# Patient Record
Sex: Female | Born: 1990 | Race: Black or African American | Hispanic: No | Marital: Single | Smoking: Never smoker
Health system: Southern US, Community
[De-identification: ages and names within clinical notes are randomized; demographics above are authoritative.]

## PROBLEM LIST (undated history)

## (undated) DIAGNOSIS — J45909 Unspecified asthma, uncomplicated: Secondary | ICD-10-CM

## (undated) DIAGNOSIS — G40909 Epilepsy, unspecified, not intractable, without status epilepticus: Secondary | ICD-10-CM

## (undated) DIAGNOSIS — R569 Unspecified convulsions: Secondary | ICD-10-CM

## (undated) HISTORY — PX: VAGAL NERVE STIMULATOR REMOVAL: SUR1110

## (undated) HISTORY — PX: IMPLANTATION VAGAL NERVE STIMULATOR: SUR692

## (undated) HISTORY — PX: BREAST SURGERY: SHX581

---

## 2014-10-20 ENCOUNTER — Emergency Department: Payer: Self-pay | Admitting: Emergency Medicine

## 2014-12-23 ENCOUNTER — Emergency Department: Payer: Self-pay | Admitting: Emergency Medicine

## 2015-01-28 ENCOUNTER — Emergency Department
Admit: 2015-01-28 | Disposition: A | Discharge status: Home / Self Care | Payer: Self-pay | Admitting: Physician Assistant

## 2015-03-20 ENCOUNTER — Emergency Department: Admission: EM | Admit: 2015-03-20 | Discharge: 2015-03-20 | Discharge status: Against Medical Advice | Payer: Self-pay

## 2015-03-20 DIAGNOSIS — M79602 Pain in left arm: Secondary | ICD-10-CM | POA: Insufficient documentation

## 2015-03-20 NOTE — ED Notes (Signed)
No answer when called from lobby 

## 2015-05-18 ENCOUNTER — Encounter: Payer: Self-pay | Admitting: Family Medicine

## 2015-05-18 ENCOUNTER — Emergency Department
Admission: EM | Admit: 2015-05-18 | Discharge: 2015-05-18 | Disposition: A | Discharge status: Home / Self Care | Payer: Self-pay | Attending: Emergency Medicine | Admitting: Emergency Medicine

## 2015-05-18 DIAGNOSIS — G40909 Epilepsy, unspecified, not intractable, without status epilepticus: Secondary | ICD-10-CM | POA: Insufficient documentation

## 2015-05-18 DIAGNOSIS — M79661 Pain in right lower leg: Secondary | ICD-10-CM | POA: Insufficient documentation

## 2015-05-18 DIAGNOSIS — M62838 Other muscle spasm: Secondary | ICD-10-CM | POA: Insufficient documentation

## 2015-05-18 HISTORY — DX: Unspecified convulsions: R56.9

## 2015-05-18 LAB — CBC WITH DIFFERENTIAL/PLATELET
BASOS ABS: 0.1 10*3/uL (ref 0–0.1)
Basophils Relative: 1 %
EOS ABS: 0.1 10*3/uL (ref 0–0.7)
Eosinophils Relative: 1 %
HCT: 33.2 % — ABNORMAL LOW (ref 35.0–47.0)
Hemoglobin: 10.4 g/dL — ABNORMAL LOW (ref 12.0–16.0)
LYMPHS PCT: 31 %
Lymphs Abs: 2.2 10*3/uL (ref 1.0–3.6)
MCH: 22.3 pg — AB (ref 26.0–34.0)
MCHC: 31.5 g/dL — ABNORMAL LOW (ref 32.0–36.0)
MCV: 70.8 fL — ABNORMAL LOW (ref 80.0–100.0)
MONOS PCT: 9 %
Monocytes Absolute: 0.6 10*3/uL (ref 0.2–0.9)
Neutro Abs: 4.2 10*3/uL (ref 1.4–6.5)
Neutrophils Relative %: 58 %
Platelets: 425 10*3/uL (ref 150–440)
RBC: 4.68 MIL/uL (ref 3.80–5.20)
RDW: 17.8 % — AB (ref 11.5–14.5)
WBC: 7.2 10*3/uL (ref 3.6–11.0)

## 2015-05-18 LAB — COMPREHENSIVE METABOLIC PANEL
ALK PHOS: 49 U/L (ref 38–126)
ALT: 16 U/L (ref 14–54)
ANION GAP: 7 (ref 5–15)
AST: 17 U/L (ref 15–41)
Albumin: 3.5 g/dL (ref 3.5–5.0)
BUN: 8 mg/dL (ref 6–20)
CALCIUM: 8.8 mg/dL — AB (ref 8.9–10.3)
CO2: 26 mmol/L (ref 22–32)
Chloride: 104 mmol/L (ref 101–111)
Creatinine, Ser: 0.65 mg/dL (ref 0.44–1.00)
GFR calc Af Amer: >60 mL/min (ref >60)
GLUCOSE: 87 mg/dL (ref 65–99)
Potassium: 4.1 mmol/L (ref 3.5–5.1)
Sodium: 137 mmol/L (ref 135–145)
Total Bilirubin: 0.3 mg/dL (ref 0.3–1.2)
Total Protein: 7.4 g/dL (ref 6.5–8.1)

## 2015-05-18 LAB — CK: Total CK: 139 U/L (ref 38–234)

## 2015-05-18 MED ORDER — DIAZEPAM 2 MG PO TABS: 2.0000 mg | ORAL_TABLET | Freq: Three times a day (TID) | ORAL | Status: DC | PRN

## 2015-05-18 MED ORDER — METHOCARBAMOL 500 MG PO TABS: 1500.0000 mg | ORAL_TABLET | Freq: Once | ORAL | Status: DC

## 2015-05-18 MED ORDER — DIAZEPAM 5 MG PO TABS
5.0000 mg | ORAL_TABLET | Freq: Once | ORAL | Status: AC
  Administered 2015-05-18: 5 mg via ORAL
  Filled 2015-05-18: qty 1

## 2015-05-18 NOTE — ED Notes (Signed)
C/o pain behind both knees past week, hurts to straighten out, states had seizure prior to pain starting

## 2015-05-18 NOTE — Discharge Instructions (Signed)
Please make sure you get your Keppra filled on Monday. Return to the ER for symptoms that change or worsen or for new concerns.

## 2015-05-18 NOTE — ED Provider Notes (Signed)
[facility] Emergency Department Provider Note ____________________________________________  Time seen: Approximately 10:10 AM  I have reviewed the triage vital signs and the nursing notes.   HISTORY  Chief Complaint Leg Pain   HPI Margaret Clark is a 24 y.o. female who presents to the emergency department for evaluation of bilateral lower extremity pain. She states the pain has been present since having a seizure last week and is worsening to the point it is painful to bear weight. She states she is "trying to walk with my knees bent." The seizure was witnessed and she denies specific injury to her legs. She states she has been out of her Keppra for "a long time" due to insurance issues, but has made arrangements to get the prescription filled on Monday.  Past Medical History  Diagnosis Date  . Seizures     There are no active problems to display for this patient.   No past surgical history on file.  Current Outpatient Rx  Name  Route  Sig  Dispense  Refill  . diazepam (VALIUM) 2 MG tablet   Oral   Take 1 tablet (2 mg total) by mouth every 8 (eight) hours as needed for anxiety or muscle spasms.   20 tablet   0     Allergies Review of patient's allergies indicates no known allergies.  No family history on file.  Social History History  Substance Use Topics  . Smoking status: Not on file  . Smokeless tobacco: Not on file  . Alcohol Use: Not on file    Review of Systems Constitutional: No recent illness. Eyes: No visual changes. ENT: No sore throat. Cardiovascular: Denies chest pain or palpitations. Respiratory: Denies shortness of breath. Gastrointestinal: No abdominal pain.  Genitourinary: Negative for dysuria. Musculoskeletal: Pain in bilateral lower extremities. Skin: Negative for rash. Neurological: Negative for headaches, focal weakness or numbness. 10-point ROS otherwise  negative.  ____________________________________________   PHYSICAL EXAM:  VITAL SIGNS: ED Triage Vitals  Enc Vitals Group     BP 05/18/15 0854 105/62 mmHg     Pulse Rate 05/18/15 0854 90     Resp 05/18/15 0854 18     Temp 05/18/15 0854 98.3 F (36.8 C)     Temp Source 05/18/15 0854 Oral     SpO2 05/18/15 0854 99 %     Weight 05/18/15 0854 245 lb (111.131 kg)     Height 05/18/15 0854  (1.626 m)     Head Cir --      Peak Flow --      Pain Score 05/18/15 0905 8     Pain Loc --      Pain Edu? --      Excl. in GC? --     Constitutional: Alert and oriented. Well appearing and in no acute distress. Eyes: Conjunctivae are normal. EOMI. Head: Atraumatic. Nose: No congestion/rhinnorhea. Neck: No stridor.  Respiratory: Normal respiratory effort.   Musculoskeletal: limited extension of knees due to muscle pain in bilateral calves Neurologic:  Normal speech and language. No gross focal neurologic deficits are appreciated. Speech is normal. No gait instability. Skin:  Skin is warm, dry and intact. Atraumatic. Psychiatric: Mood and affect are normal. Speech and behavior are normal.  ____________________________________________   LABS (all labs ordered are listed, but only abnormal results are displayed)  Labs Reviewed  COMPREHENSIVE METABOLIC PANEL - Abnormal; Notable for the following:    Calcium 8.8 (*)    All other components within normal limits  CBC WITH DIFFERENTIAL/PLATELET - Abnormal; Notable for the following:    Hemoglobin 10.4 (*)    HCT 33.2 (*)    MCV 70.8 (*)    MCH 22.3 (*)    MCHC 31.5 (*)    RDW 17.8 (*)    All other components within normal limits  CK   ____________________________________________  RADIOLOGY  Not indicated. ____________________________________________   PROCEDURES  Procedure(s) performed: None   ____________________________________________   INITIAL IMPRESSION / ASSESSMENT AND PLAN / ED COURSE  Pertinent labs &  imaging results that were available during my care of the patient were reviewed by me and considered in my medical decision making (see chart for details).  Prescription for diazepam 2 mg will be given to help prevent seizures as well as help with muscle pain. The patient was advised to get her Keppra prescription filled on Monday as planned. She was advised to return to the emergency department for symptoms that change or worsen if she is unable schedule an appointment. She was also advised that she is slightly anemic and will need to follow-up with primary care to monitor her hemoglobin and hematocrit. She states that this will now be possible since she has insurance. ____________________________________________   FINAL CLINICAL IMPRESSION(S) / ED DIAGNOSES  Final diagnoses:  Muscle spasms of both lower extremities      Chinita Pester, FNP 05/18/15 1530  [name], MD 05/19/15 [phone]

## 2015-05-18 NOTE — ED Notes (Signed)
Pt states that she is having pain in her bilat posterior legs from her calves to her mid hamstrings. Pt denies injury or back pain

## 2015-07-03 ENCOUNTER — Encounter: Payer: Self-pay | Admitting: *Deleted

## 2015-07-03 ENCOUNTER — Emergency Department
Admission: EM | Admit: 2015-07-03 | Discharge: 2015-07-03 | Disposition: A | Discharge status: Home / Self Care | Payer: No Typology Code available for payment source | Attending: Emergency Medicine | Admitting: Emergency Medicine

## 2015-07-03 DIAGNOSIS — R112 Nausea with vomiting, unspecified: Secondary | ICD-10-CM | POA: Diagnosis not present

## 2015-07-03 DIAGNOSIS — J029 Acute pharyngitis, unspecified: Secondary | ICD-10-CM | POA: Diagnosis present

## 2015-07-03 DIAGNOSIS — J069 Acute upper respiratory infection, unspecified: Secondary | ICD-10-CM | POA: Diagnosis not present

## 2015-07-03 MED ORDER — ONDANSETRON 8 MG PO TBDP
8.0000 mg | ORAL_TABLET | Freq: Once | ORAL | Status: AC
  Administered 2015-07-03: 8 mg via ORAL
  Filled 2015-07-03: qty 1

## 2015-07-03 MED ORDER — IBUPROFEN 800 MG PO TABS
800.0000 mg | ORAL_TABLET | Freq: Once | ORAL | Status: AC
  Administered 2015-07-03: 800 mg via ORAL

## 2015-07-03 MED ORDER — ONDANSETRON HCL 4 MG PO TABS: 4.0000 mg | ORAL_TABLET | Freq: Every day | ORAL | Status: DC | PRN

## 2015-07-03 MED ORDER — IBUPROFEN 800 MG PO TABS
ORAL_TABLET | ORAL | Status: DC
Start: 2015-07-03 — End: 2015-07-03
  Filled 2015-07-03: qty 1

## 2015-07-03 MED ORDER — DICLOFENAC SODIUM 75 MG PO TBEC: 75.0000 mg | DELAYED_RELEASE_TABLET | Freq: Two times a day (BID) | ORAL | Status: DC

## 2015-07-03 NOTE — Discharge Instructions (Signed)

## 2015-07-03 NOTE — ED Notes (Signed)
Pt states for 3 days she has had a cough, sneezing, and pressure in her head. Temp 100 today at home, no meds prior to arrival.

## 2015-07-03 NOTE — ED Provider Notes (Signed)
[facility] Emergency Department Provider Note  ____________________________________________  Time seen: Approximately 7:10 PM  I have reviewed the triage vital signs and the nursing notes.   HISTORY  Chief Complaint URI   HPI KARYSSA AMARAL is a 24 y.o. female presenting to the ED today with four days of cough, sneezing and sore throat. She reports the cough is non-productive and present throughout the day. She reports general malaise, fatigue and body aches as well as some chest discomfort with coughing. She reports nausea with three episodes of emesis today but denies any diarrhea. She states she had a fever of 100.0 recorded at home today and has a fever or 100.7 in the ED today. She denies using any OTC medications.   Past Medical History  Diagnosis Date  . Seizures     There are no active problems to display for this patient.   History reviewed. No pertinent past surgical history.  Current Outpatient Rx  Name  Route  Sig  Dispense  Refill  . diazepam (VALIUM) 2 MG tablet   Oral   Take 1 tablet (2 mg total) by mouth every 8 (eight) hours as needed for anxiety or muscle spasms.   20 tablet   0   . diclofenac (VOLTAREN) 75 MG EC tablet   Oral   Take 1 tablet (75 mg total) by mouth 2 (two) times daily.   14 tablet   0   . ondansetron (ZOFRAN) 4 MG tablet   Oral   Take 1 tablet (4 mg total) by mouth daily as needed for nausea or vomiting.   8 tablet   0     Allergies Review of patient's allergies indicates no known allergies.  No family history on file.  Social History Social History  Substance Use Topics  . Smoking status: Never Smoker   . Smokeless tobacco: None  . Alcohol Use: Yes    Review of Systems Constitutional: Fever and chills present. Eyes: No visual changes. ENT: Positive for sore throat, sinus discomfort, and runny nose.  Cardiovascular: Denies chest pain. Respiratory: Denies shortness of  breath. Gastrointestinal: No abdominal pain. Positive for nausea and vomiting.  No diarrhea.  No constipation. Genitourinary: Negative for dysuria. Musculoskeletal: Negative for back pain. Skin: Negative for rash. Neurological: Negative for headaches, focal weakness or numbness.  10-point ROS otherwise negative.  ____________________________________________   PHYSICAL EXAM:  VITAL SIGNS: ED Triage Vitals  Enc Vitals Group     BP 07/03/15 1825 129/73 mmHg     Pulse Rate 07/03/15 1825 119     Resp 07/03/15 1825 18     Temp 07/03/15 1825 100.7 F (38.2 C)     Temp Source 07/03/15 1825 Oral     SpO2 07/03/15 1825 97 %     Weight 07/03/15 1825 245 lb (111.131 kg)     Height 07/03/15 1825 5\' 4"  (1.626 m)     Head Cir --      Peak Flow --      Pain Score 07/03/15 1825 10     Pain Loc --      Pain Edu? --      Excl. in GC? --     Constitutional: Alert and oriented. Well appearing and in mild discomfort. Eyes: Conjunctivae are normal. PERRL. EOMI. Head: Atraumatic. Nose: Mild congestion/rhinnorhea. Mouth/Throat: Mucous membranes are moist.  Oropharynx non-erythematous. Neck: No stridor. No cervical tenderness or lymphadenopathy.  Cardiovascular: Normal rate, regular rhythm. Grossly normal heart sounds.  Good peripheral  circulation. Respiratory: Normal respiratory effort.  No retractions. Lungs CTAB. Gastrointestinal: Soft and nontender. No distention. No abdominal bruits. No CVA tenderness. Musculoskeletal: No lower extremity tenderness nor edema.  No joint effusions. Neurologic:  Normal speech and language. No gross focal neurologic deficits are appreciated. No gait instability. Skin:  Skin is warm, dry and intact. No rash noted. Psychiatric: Mood and affect are normal. Speech and behavior are normal.  ____________________________________________   LABS (all labs ordered are listed, but only abnormal results are displayed)  Labs Reviewed - No data to  display ____________________________________________  EKG   ____________________________________________  RADIOLOGY   ____________________________________________   PROCEDURES  Procedure(s) performed: None  Critical Care performed: No  ____________________________________________   INITIAL IMPRESSION / ASSESSMENT AND PLAN / ED COURSE  Pertinent labs & imaging results that were available during my care of the patient were reviewed by me and considered in my medical decision making (see chart for details).  Patient presents with a viral URI x 4 days. Instructed to continue with fluid and rest, as well as tylenol for fevers and body aches. Prescribed zofran for nausea. Motrin 800 mg given in the ED for discomfort with coughing and Voltaren prescribed for seven days.  ____________________________________________   FINAL CLINICAL IMPRESSION(S) / ED DIAGNOSES  Final diagnoses:  Viral URI      [name], PA-C 07/03/15 2028  Loleta Rose, MD 07/03/15 [phone]

## 2015-07-24 ENCOUNTER — Emergency Department
Admission: EM | Admit: 2015-07-24 | Discharge: 2015-07-24 | Disposition: A | Discharge status: Home / Self Care | Payer: No Typology Code available for payment source | Attending: Emergency Medicine | Admitting: Emergency Medicine

## 2015-07-24 DIAGNOSIS — Z79899 Other long term (current) drug therapy: Secondary | ICD-10-CM | POA: Insufficient documentation

## 2015-07-24 DIAGNOSIS — M436 Torticollis: Secondary | ICD-10-CM | POA: Diagnosis not present

## 2015-07-24 DIAGNOSIS — M542 Cervicalgia: Secondary | ICD-10-CM | POA: Diagnosis present

## 2015-07-24 MED ORDER — NAPROXEN 500 MG PO TABS: 500.0000 mg | ORAL_TABLET | Freq: Two times a day (BID) | ORAL | Status: DC

## 2015-07-24 MED ORDER — CYCLOBENZAPRINE HCL 10 MG PO TABS: 10.0000 mg | ORAL_TABLET | Freq: Three times a day (TID) | ORAL | Status: DC | PRN

## 2015-07-24 NOTE — Discharge Instructions (Signed)
Buy a new pillow to see if that will help. Use a heating pad 20 minutes per hour. Follow up with the primary care provider of your choice.

## 2015-07-24 NOTE — ED Provider Notes (Signed)
[facility] Emergency Department Provider Note ____________________________________________  Time seen: Approximately 1:03 PM  I have reviewed the triage vital signs and the nursing notes.   HISTORY  Chief Complaint Neck Pain   HPI Margaret Clark is a 24 y.o. female who presents again to the emergency department for evaluation of neck pain. She states that the pain is mainly on the left side of the neck, but both sides hurt when laying down at night. She states that she sleeps on a big couch pillow. She has not been taken anything for pain over the past few days.   Past Medical History  Diagnosis Date  . Seizures (HCC)     There are no active problems to display for this patient.   History reviewed. No pertinent past surgical history.  Current Outpatient Rx  Name  Route  Sig  Dispense  Refill  . levETIRAcetam (KEPPRA) 500 MG tablet   Oral   Take 500 mg by mouth 4 (four) times daily.         . cyclobenzaprine (FLEXERIL) 10 MG tablet   Oral   Take 1 tablet (10 mg total) by mouth 3 (three) times daily as needed for muscle spasms.   30 tablet   0   . diazepam (VALIUM) 2 MG tablet   Oral   Take 1 tablet (2 mg total) by mouth every 8 (eight) hours as needed for anxiety or muscle spasms.   20 tablet   0   . naproxen (NAPROSYN) 500 MG tablet   Oral   Take 1 tablet (500 mg total) by mouth 2 (two) times daily with a meal.   60 tablet   2   . ondansetron (ZOFRAN) 4 MG tablet   Oral   Take 1 tablet (4 mg total) by mouth daily as needed for nausea or vomiting.   8 tablet   0     Allergies Shellfish allergy  No family history on file.  Social History Social History  Substance Use Topics  . Smoking status: Never Smoker   . Smokeless tobacco: None  . Alcohol Use: Yes    Review of Systems Constitutional: No recent illness. Eyes: No visual changes. ENT: No sore throat. Cardiovascular: Denies chest pain or  palpitations. Respiratory: Denies shortness of breath. Gastrointestinal: No abdominal pain.  Genitourinary: Negative for dysuria. Musculoskeletal: Pain in left side of her neck during the day and both sides at night. Skin: Negative for rash. Neurological: Negative for headaches, focal weakness or numbness. 10-point ROS otherwise negative.  ____________________________________________   PHYSICAL EXAM:  VITAL SIGNS: ED Triage Vitals  Enc Vitals Group     BP 07/24/15 0858 120/77 mmHg     Pulse Rate 07/24/15 0856 84     Resp 07/24/15 0855 18     Temp 07/24/15 0855 98.7 F (37.1 C)     Temp Source 07/24/15 0855 Oral     SpO2 07/24/15 0856 97 %     Weight 07/24/15 0907 247 lb (112.038 kg)     Height 07/24/15 0855  (1.626 m)     Head Cir --      Peak Flow --      Pain Score 07/24/15 0856 8     Pain Loc --      Pain Edu? --      Excl. in GC? --     Constitutional: Alert and oriented. Well appearing and in no acute distress. Eyes: Conjunctivae are normal. EOMI. Head: Atraumatic.  Nose: No congestion/rhinnorhea. Neck: No stridor.  Respiratory: Normal respiratory effort.   Musculoskeletal: Pain with attempting to turn head to left. No midline tenderness. Nexus criteria negative.  Neurologic:  Normal speech and language. No gross focal neurologic deficits are appreciated. Speech is normal. No gait instability. Skin:  Skin is warm, dry and intact. Atraumatic. Psychiatric: Mood and affect are normal. Speech and behavior are normal.  ____________________________________________   LABS (all labs ordered are listed, but only abnormal results are displayed)  Labs Reviewed - No data to display ____________________________________________  RADIOLOGY  Not indicated. ____________________________________________   PROCEDURES  Procedure(s) performed: None   ____________________________________________   INITIAL IMPRESSION / ASSESSMENT AND PLAN / ED COURSE  Pertinent  labs & imaging results that were available during my care of the patient were reviewed by me and considered in my medical decision making (see chart for details).  Patient was advised to stop sleeping on the big couch pillow. She was advised to get a pillow that keeps her neck in alignment with her spine.  Patient was advised to follow up with the primary care provider for symptoms that are not improving over the next week. She was advised to return to the emergency department for symptoms that change or worsen if unable to schedule an appointment with the primary care provider or specialist. ____________________________________________   FINAL CLINICAL IMPRESSION(S) / ED DIAGNOSES  Final diagnoses:  Torticollis, acute       Chinita Pester, FNP 07/24/15 1414  [name], MD 07/24/15 [phone]

## 2015-07-24 NOTE — ED Notes (Signed)
Pt c/o left sided neck pain for the past month, worse in the past couple of days,

## 2015-07-24 NOTE — ED Notes (Signed)
States she developed pain to her neck about 1 month ago. States she is able flex and ext neck  Min pain when turning to left. Pain has increased over the past 1-2 days

## 2015-08-22 ENCOUNTER — Encounter: Payer: Self-pay | Admitting: Emergency Medicine

## 2015-08-22 ENCOUNTER — Emergency Department
Admission: EM | Admit: 2015-08-22 | Discharge: 2015-08-22 | Disposition: A | Discharge status: Home / Self Care | Payer: No Typology Code available for payment source | Attending: Emergency Medicine | Admitting: Emergency Medicine

## 2015-08-22 ENCOUNTER — Emergency Department: Payer: No Typology Code available for payment source

## 2015-08-22 DIAGNOSIS — Y9389 Activity, other specified: Secondary | ICD-10-CM | POA: Insufficient documentation

## 2015-08-22 DIAGNOSIS — Y998 Other external cause status: Secondary | ICD-10-CM | POA: Diagnosis not present

## 2015-08-22 DIAGNOSIS — Y9289 Other specified places as the place of occurrence of the external cause: Secondary | ICD-10-CM | POA: Insufficient documentation

## 2015-08-22 DIAGNOSIS — Z791 Long term (current) use of non-steroidal anti-inflammatories (NSAID): Secondary | ICD-10-CM | POA: Insufficient documentation

## 2015-08-22 DIAGNOSIS — Z79899 Other long term (current) drug therapy: Secondary | ICD-10-CM | POA: Diagnosis not present

## 2015-08-22 DIAGNOSIS — S99922A Unspecified injury of left foot, initial encounter: Secondary | ICD-10-CM | POA: Diagnosis present

## 2015-08-22 DIAGNOSIS — S9032XA Contusion of left foot, initial encounter: Secondary | ICD-10-CM | POA: Insufficient documentation

## 2015-08-22 DIAGNOSIS — W2209XA Striking against other stationary object, initial encounter: Secondary | ICD-10-CM | POA: Diagnosis not present

## 2015-08-22 MED ORDER — OXYCODONE-ACETAMINOPHEN 5-325 MG PO TABS
1.0000 | ORAL_TABLET | Freq: Once | ORAL | Status: AC
  Administered 2015-08-22: 1 via ORAL
  Filled 2015-08-22: qty 1

## 2015-08-22 MED ORDER — ETODOLAC 200 MG PO CAPS
200.0000 mg | ORAL_CAPSULE | Freq: Three times a day (TID) | ORAL | Status: DC
Start: 2015-08-22 — End: 2015-11-16

## 2015-08-22 NOTE — ED Notes (Addendum)
Patient transported to X-ray 

## 2015-08-22 NOTE — ED Notes (Signed)
Pt to triage via w/c with no distress noted; reports dropping dresser on left foot yesterday; c/o persistent pain

## 2015-08-22 NOTE — ED Provider Notes (Signed)
[facility] Emergency Department Provider Note  ____________________________________________  Time seen: Approximately 5:29 AM  I have reviewed the triage vital signs and the nursing notes.   HISTORY  Chief Complaint Foot Pain    HPI Margaret Clark is a 24 y.o. female who comes into the hospital today with left foot pain. The patient reports that she dropped a dresser on her left foot. She reports that she was taking outside to painted and it just fell on her foot. The patient reports that this occurred about 5 PM yesterday. She put ice on it and reports she took naproxen. She reports though that the pain woke her up out of her sleep and that she can't even put a sock or shoe on her foot. The patient rates her pain a 9 out of 10 in intensity. The patient reports she is unable to walk on her foot as well. The pain is to the top of her foot and both sides. The patient is able to move her ankle and her foot without significant difficulty but again is having some pain. She reports she tried to stay out of the hospital but could not so she came in for evaluation.The patient denies any fall denies injuring her head her back or her abdomen.   Past Medical History  Diagnosis Date  . Seizures (HCC)     There are no active problems to display for this patient.   History reviewed. No pertinent past surgical history.  Current Outpatient Rx  Name  Route  Sig  Dispense  Refill  . cyclobenzaprine (FLEXERIL) 10 MG tablet   Oral   Take 1 tablet (10 mg total) by mouth 3 (three) times daily as needed for muscle spasms.   30 tablet   0   . diazepam (VALIUM) 2 MG tablet   Oral   Take 1 tablet (2 mg total) by mouth every 8 (eight) hours as needed for anxiety or muscle spasms.   20 tablet   0              . levETIRAcetam (KEPPRA) 500 MG tablet   Oral   Take 500 mg by mouth 4 (four) times daily.         . naproxen (NAPROSYN) 500 MG tablet   Oral   Take 1  tablet (500 mg total) by mouth 2 (two) times daily with a meal.   60 tablet   2   . ondansetron (ZOFRAN) 4 MG tablet   Oral   Take 1 tablet (4 mg total) by mouth daily as needed for nausea or vomiting.   8 tablet   0     Allergies Shellfish allergy  No family history on file.  Social History Social History  Substance Use Topics  . Smoking status: Never Smoker   . Smokeless tobacco: None  . Alcohol Use: Yes    Review of Systems Constitutional: No fever/chills Eyes: No visual changes. ENT: No sore throat. Cardiovascular: Denies chest pain. Respiratory: Denies shortness of breath. Gastrointestinal: No abdominal pain.  No nausea, no vomiting.  No diarrhea.  No constipation. Genitourinary: Negative for dysuria. Musculoskeletal: Left foot pain Skin: Negative for rash. Neurological: Negative for headaches, focal weakness or numbness.  10-point ROS otherwise negative.  ____________________________________________   PHYSICAL EXAM:  VITAL SIGNS: ED Triage Vitals  Enc Vitals Group     BP 08/22/15 0452 120/77 mmHg     Pulse Rate 08/22/15 0452 88     Resp 08/22/15  0452 20     Temp 08/22/15 0452 97.9 F (36.6 C)     Temp Source 08/22/15 0452 Oral     SpO2 08/22/15 0452 99 %     Weight 08/22/15 0452 250 lb (113.399 kg)     Height 08/22/15 0452  (1.626 m)     Head Cir --      Peak Flow --      Pain Score 08/22/15 0450 9     Pain Loc --      Pain Edu? --      Excl. in GC? --     Constitutional: Alert and oriented. Well appearing and in moderate distress. Eyes: Conjunctivae are normal. PERRL. EOMI. Head: Atraumatic. Nose: No congestion/rhinnorhea. Mouth/Throat: Mucous membranes are moist.  Oropharynx non-erythematous. Cardiovascular: Normal rate, regular rhythm. Grossly normal heart sounds.  Good peripheral circulation. Respiratory: Normal respiratory effort.  No retractions. Lungs CTAB. Gastrointestinal: Soft and nontender. No distention. Positive bowel  sounds Musculoskeletal: Mild soft tissue swelling to the top of the left foot with some tenderness to palpation. No swelling or pain in the ankles no pain or swelling of the heels. Patient able to move her toes without difficulty.  Neurologic:  Normal speech and language. No gross focal neurologic deficits are appreciated.  Skin:  Skin is warm, dry and intact.  Psychiatric: Mood and affect are normal.   ____________________________________________   LABS (all labs ordered are listed, but only abnormal results are displayed)  Labs Reviewed - No data to display ____________________________________________  EKG  None ____________________________________________  RADIOLOGY  Left foot x-ray: Negative ____________________________________________   PROCEDURES  Procedure(s) performed: None  Critical Care performed: No  ____________________________________________   INITIAL IMPRESSION / ASSESSMENT AND PLAN / ED COURSE  Pertinent labs & imaging results that were available during my care of the patient were reviewed by me and considered in my medical decision making (see chart for details).  This is a 24 year old female who comes in today with some left foot pain after dropping a dresser on her foot. Patient has some minimal soft tissue swelling but does have some tenderness palpation. The patient's x-ray does not show an acute fracture. I discussed with the patient that there is always a chance she may have a hairline fracture that is just not picked up on this x-ray and she should follow up with orthopedic to have the area reimaged to evaluate for callous formation. The patient will be given a dose of Percocet and we will wrap her foot with Ace wrap and give her a soft shoe. I will also give the patient some days off work as she is a Conservation officer, nature and stands all day. The patient will be discharged home to follow-up with orthopedic  surgery. ____________________________________________   FINAL CLINICAL IMPRESSION(S) / ED DIAGNOSES  Final diagnoses:  Foot contusion, left, initial encounter      Rebecka Apley, MD 08/22/15 [phone]

## 2015-08-22 NOTE — Discharge Instructions (Signed)
Contusion A contusion is a deep bruise. Contusions are the result of a blunt injury to tissues and muscle fibers under the skin. The injury causes bleeding under the skin. The skin overlying the contusion may turn blue, purple, or yellow. Minor injuries will give you a painless contusion, but more severe contusions may stay painful and swollen for a few weeks.  CAUSES  This condition is usually caused by a blow, trauma, or direct force to an area of the body. SYMPTOMS  Symptoms of this condition include:  Swelling of the injured area.  Pain and tenderness in the injured area.  Discoloration. The area may have redness and then turn blue, purple, or yellow. DIAGNOSIS  This condition is diagnosed based on a physical exam and medical history. An X-ray, CT scan, or MRI may be needed to determine if there are any associated injuries, such as broken bones (fractures). TREATMENT  Specific treatment for this condition depends on what area of the body was injured. In general, the best treatment for a contusion is resting, icing, applying pressure to (compression), and elevating the injured area. This is often called the RICE strategy. Over-the-counter anti-inflammatory medicines may also be recommended for pain control.  HOME CARE INSTRUCTIONS   Rest the injured area.  If directed, apply ice to the injured area:  Put ice in a plastic bag.  Place a towel between your skin and the bag.  Leave the ice on for 20 minutes, 2-3 times per day.  If directed, apply light compression to the injured area using an elastic bandage. Make sure the bandage is not wrapped too tightly. Remove and reapply the bandage as directed by your health care provider.  If possible, raise (elevate) the injured area above the level of your heart while you are sitting or lying down.  Take over-the-counter and prescription medicines only as told by your health care provider. SEEK MEDICAL CARE IF:  Your symptoms do not  improve after several days of treatment.  Your symptoms get worse.  You have difficulty moving the injured area. SEEK IMMEDIATE MEDICAL CARE IF:   You have severe pain.  You have numbness in a hand or foot.  Your hand or foot turns pale or cold.   This information is not intended to replace advice given to you by your health care provider. Make sure you discuss any questions you have with your health care provider.   Document Released: 07/16/2005 Document Revised: 06/27/2015 Document Reviewed: 02/21/2015 Elsevier Interactive Patient Education 2016 [place] Contusion  A foot contusion is a deep bruise to the foot. Contusions happen when an injury causes bleeding under the skin. Signs of bruising include pain, puffiness (swelling), and discolored skin. The contusion may turn blue, purple, or yellow. HOME CARE  Put ice on the injured area.  Put ice in a plastic bag.  Place a towel between your skin and the bag.  Leave the ice on for 15-20 minutes, 03-04 times a day.  Only take medicines as told by your doctor.  Use an elastic wrap only as told. You may remove the wrap for sleeping, showering, and bathing. Take the wrap off if you lose feeling (numb) in your toes, or they turn blue or cold. Put the wrap on more loosely.  Keep the foot raised (elevated) with pillows.  If your foot hurts, avoid standing or walking.  When your doctor says it is okay to use your foot, start using it slowly. If you have pain,  lessen how much you use your foot.  See your doctor as told. GET HELP RIGHT AWAY IF:   You have more redness, puffiness, or pain in your foot.  Your puffiness or pain does not get better with medicine.  You lose feeling in your foot, or you cannot move your toes.  Your foot turns cold or blue.  You have pain when you move your toes.  Your foot feels warm.  Your contusion does not get better in 2 days. MAKE SURE YOU:   Understand these  instructions.  Will watch this condition.  Will get help right away if you or your child is not doing well or gets worse.   This information is not intended to replace advice given to you by your health care provider. Make sure you discuss any questions you have with your health care provider.   Document Released: 07/15/2008 Document Revised: 04/06/2012 Document Reviewed: 06/12/2015 Elsevier Interactive Patient Education [employer] Inc[employer]

## 2015-08-23 ENCOUNTER — Encounter: Payer: Self-pay | Admitting: Emergency Medicine

## 2015-08-23 ENCOUNTER — Emergency Department
Admission: EM | Admit: 2015-08-23 | Discharge: 2015-08-23 | Disposition: A | Discharge status: Home / Self Care | Payer: No Typology Code available for payment source | Attending: Emergency Medicine | Admitting: Emergency Medicine

## 2015-08-23 DIAGNOSIS — Z79899 Other long term (current) drug therapy: Secondary | ICD-10-CM | POA: Insufficient documentation

## 2015-08-23 DIAGNOSIS — S9032XD Contusion of left foot, subsequent encounter: Secondary | ICD-10-CM | POA: Insufficient documentation

## 2015-08-23 DIAGNOSIS — Z791 Long term (current) use of non-steroidal anti-inflammatories (NSAID): Secondary | ICD-10-CM | POA: Diagnosis not present

## 2015-08-23 DIAGNOSIS — X58XXXD Exposure to other specified factors, subsequent encounter: Secondary | ICD-10-CM | POA: Insufficient documentation

## 2015-08-23 DIAGNOSIS — S99922D Unspecified injury of left foot, subsequent encounter: Secondary | ICD-10-CM | POA: Diagnosis present

## 2015-08-23 NOTE — ED Provider Notes (Signed)
I was called to see the patient as a supervisor because the patient is upset that we are unable to provide her with a boot. She is frustrated that she came in that we are unable to do anything for her. I explained to her that we do not have boots in the emergency department and cannot provide that we do not have. She requests to speak to my supervisor, we have provided her with patient relations contact information  Jene Everyobert Cherise Fedder, MD 08/23/15 1443

## 2015-08-23 NOTE — ED Notes (Signed)
States she had an injury to left foot couple of days ago. States a dresser fell onto left foot.. Then 2 additional things drop on to foot.Marland Kitchen. also needs a noted to go to work

## 2015-08-23 NOTE — ED Notes (Addendum)
Pt had a seizure a couple days ago and hurt left foot during seizure.  She had another seizure and chair landed on her foot.  Then today at work someone dropped a Water engineerbox ont he same left foot.  Today box dropped on foot had metal in it.  Not workers comp per pt

## 2015-08-23 NOTE — Discharge Instructions (Signed)
Follow up with Dr. Orland Jarredroxler for your foot pain Ice and elevate your foot Wear wooden shoe for support

## 2015-08-23 NOTE — ED Provider Notes (Addendum)
[facility] Emergency Department Provider Note  ____________________________________________  Time seen: Approximately 1:34 PM  I have reviewed the triage vital signs and the nursing notes.   HISTORY  Chief Complaint Foot Pain   HPI Margaret Clark is a 24 y.o. female is here with complaint of inability to work while wearing a postop shoe. Patient states that her boss will not let her work as long as she is in the postop shoe that she was placed in for her injury on 11/2.  Patient has called 3 different orthopedic offices with the same response. They cannot see her immediately and they do not have any other type of walking shoe. Patient states today she tried to wear a pair shoes to work but was unable to due to pain. She states that they will not provide light duty for work. Currently she rates her pain is 9 out of 10.   Past Medical History  Diagnosis Date  . Seizures (HCC)     There are no active problems to display for this patient.   History reviewed. No pertinent past surgical history.  Current Outpatient Rx  Name  Route  Sig  Dispense  Refill  . cyclobenzaprine (FLEXERIL) 10 MG tablet   Oral   Take 1 tablet (10 mg total) by mouth 3 (three) times daily as needed for muscle spasms.   30 tablet   0   . diazepam (VALIUM) 2 MG tablet   Oral   Take 1 tablet (2 mg total) by mouth every 8 (eight) hours as needed for anxiety or muscle spasms.   20 tablet   0   . etodolac (LODINE) 200 MG capsule   Oral   Take 1 capsule (200 mg total) by mouth every 8 (eight) hours.   12 capsule   0   . levETIRAcetam (KEPPRA) 500 MG tablet   Oral   Take 500 mg by mouth 4 (four) times daily.         . naproxen (NAPROSYN) 500 MG tablet   Oral   Take 1 tablet (500 mg total) by mouth 2 (two) times daily with a meal.   60 tablet   2   . ondansetron (ZOFRAN) 4 MG tablet   Oral   Take 1 tablet (4 mg total) by mouth daily as needed for nausea or  vomiting.   8 tablet   0     Allergies Shellfish allergy  History reviewed. No pertinent family history.  Social History Social History  Substance Use Topics  . Smoking status: Never Smoker   . Smokeless tobacco: None  . Alcohol Use: Yes    Review of Systems Constitutional: No fever/chills Cardiovascular: Denies chest pain. Respiratory: Denies shortness of breath. Musculoskeletal: Negative for back pain. Positive left foot pain Skin: Negative for rash. Neurological: Negative for headaches, focal weakness or numbness.  10-point ROS otherwise negative.  ____________________________________________   PHYSICAL EXAM:  VITAL SIGNS: ED Triage Vitals  Enc Vitals Group     BP 08/23/15 1254 118/104 mmHg     Pulse Rate 08/23/15 1253 89     Resp 08/23/15 1253 20     Temp 08/23/15 1253 98.6 F (37 C)     Temp src --      SpO2 08/23/15 1253 100 %     Weight 08/23/15 1255 250 lb (113.399 kg)     Height 08/23/15 1255  (1.626 m)     Head Cir --  Peak Flow --      Pain Score 08/23/15 1256 9     Pain Loc --      Pain Edu? --      Excl. in GC? --     Constitutional: Alert and oriented. Well appearing and in no acute distress. Eyes: Conjunctivae are normal. PERRL. EOMI. Head: Atraumatic. Nose: No congestion/rhinnorhea. Neck: No stridor.   Cardiovascular: Normal rate, regular rhythm. Grossly normal heart sounds.  Good peripheral circulation. Respiratory: Normal respiratory effort.  No retractions. Lungs CTAB. Gastrointestinal: Soft and nontender. No distention. Musculoskeletal: No lower extremity tenderness nor edema.  No joint effusions. Neurologic:  Normal speech and language. No gross focal neurologic deficits are appreciated. No gait instability. Skin:  Skin is warm, dry and intact. No rash noted. Psychiatric: Mood and affect are normal. Speech and behavior are normal.  ____________________________________________   LABS (all labs ordered are listed, but  only abnormal results are displayed)  Labs Reviewed - No data to display  PROCEDURES  Procedure(s) performed: None  Critical Care performed: No  ____________________________________________   INITIAL IMPRESSION / ASSESSMENT AND PLAN / ED COURSE  Pertinent labs & imaging results that were available during my care of the patient were reviewed by me and considered in my medical decision making (see chart for details).  Patient was given a note stating that she would need with postop shoe for as long as she is unable to wear a regular shoe. Patient is to ice and elevate as needed for swelling. She'll continue taking ibuprofen as needed. She is also given the name of Dr. Orland Jarredroxler who is the podiatrist on call today if follow-up is still needed. ____________________________________________   FINAL CLINICAL IMPRESSION(S) / ED DIAGNOSES  Final diagnoses:  Contusion, foot, left, subsequent encounter   ----------------------------------------- 2:55 PM on 08/23/2015 -----------------------------------------  Patient states prior to getting that she will be coming back to the emergency room every 2 to 3 days to get an extension of her work note extended until she is able to put her shoe on. It was explained to her that this was inappropriate and that we still would not have anything other than a postop shoe to offer her to wear.   Tommi RumpsRhonda L Ketty Bitton, PA-C 08/23/15 1419  Phineas SemenGraydon Goodman, MD 08/23/15 1453  Tommi Rumpshonda L Ziara Thelander, PA-C 08/23/15 1457  Phineas SemenGraydon Goodman, MD 08/23/15 [phone]

## 2015-08-27 ENCOUNTER — Encounter: Payer: Self-pay | Admitting: Emergency Medicine

## 2015-08-27 ENCOUNTER — Emergency Department
Admission: EM | Admit: 2015-08-27 | Discharge: 2015-08-27 | Disposition: A | Discharge status: Home / Self Care | Payer: No Typology Code available for payment source | Attending: Emergency Medicine | Admitting: Emergency Medicine

## 2015-08-27 DIAGNOSIS — R569 Unspecified convulsions: Secondary | ICD-10-CM

## 2015-08-27 DIAGNOSIS — G40909 Epilepsy, unspecified, not intractable, without status epilepticus: Secondary | ICD-10-CM | POA: Insufficient documentation

## 2015-08-27 DIAGNOSIS — Z3202 Encounter for pregnancy test, result negative: Secondary | ICD-10-CM | POA: Diagnosis not present

## 2015-08-27 LAB — URINALYSIS COMPLETE WITH MICROSCOPIC (ARMC ONLY)
Bacteria, UA: NONE SEEN
Bilirubin Urine: NEGATIVE
GLUCOSE, UA: NEGATIVE mg/dL
HGB URINE DIPSTICK: NEGATIVE
Ketones, ur: NEGATIVE mg/dL
LEUKOCYTES UA: NEGATIVE
Nitrite: NEGATIVE
Protein, ur: NEGATIVE mg/dL
Specific Gravity, Urine: 1.021 (ref 1.005–1.030)
pH: 5 (ref 5.0–8.0)

## 2015-08-27 LAB — CBC WITH DIFFERENTIAL/PLATELET
BASOS ABS: 0 10*3/uL (ref 0–0.1)
Basophils Relative: 1 %
Eosinophils Absolute: 0.2 10*3/uL (ref 0–0.7)
Eosinophils Relative: 3 %
HEMATOCRIT: 29.6 % — AB (ref 35.0–47.0)
Hemoglobin: 9.3 g/dL — ABNORMAL LOW (ref 12.0–16.0)
LYMPHS PCT: 42 %
Lymphs Abs: 2.4 10*3/uL (ref 1.0–3.6)
MCH: 22.2 pg — ABNORMAL LOW (ref 26.0–34.0)
MCHC: 31.3 g/dL — AB (ref 32.0–36.0)
MCV: 70.9 fL — AB (ref 80.0–100.0)
MONO ABS: 0.4 10*3/uL (ref 0.2–0.9)
MONOS PCT: 6 %
NEUTROS ABS: 2.8 10*3/uL (ref 1.4–6.5)
Neutrophils Relative %: 48 %
Platelets: 374 10*3/uL (ref 150–440)
RBC: 4.17 MIL/uL (ref 3.80–5.20)
RDW: 18.1 % — ABNORMAL HIGH (ref 11.5–14.5)
WBC: 5.8 10*3/uL (ref 3.6–11.0)

## 2015-08-27 LAB — COMPREHENSIVE METABOLIC PANEL
ALT: 16 U/L (ref 14–54)
AST: 18 U/L (ref 15–41)
Albumin: 3.6 g/dL (ref 3.5–5.0)
Alkaline Phosphatase: 48 U/L (ref 38–126)
Anion gap: 4 — ABNORMAL LOW (ref 5–15)
BILIRUBIN TOTAL: 0.4 mg/dL (ref 0.3–1.2)
BUN: 11 mg/dL (ref 6–20)
CALCIUM: 8.6 mg/dL — AB (ref 8.9–10.3)
CO2: 26 mmol/L (ref 22–32)
Chloride: 107 mmol/L (ref 101–111)
Creatinine, Ser: 0.78 mg/dL (ref 0.44–1.00)
GFR calc Af Amer: >60 mL/min (ref >60)
Glucose, Bld: 84 mg/dL (ref 65–99)
POTASSIUM: 4 mmol/L (ref 3.5–5.1)
Sodium: 137 mmol/L (ref 135–145)
TOTAL PROTEIN: 7.1 g/dL (ref 6.5–8.1)

## 2015-08-27 LAB — PREGNANCY, URINE: PREG TEST UR: NEGATIVE

## 2015-08-27 NOTE — ED Provider Notes (Signed)
-----------------------------------------   4:58 PM on 08/27/2015 -----------------------------------------  This patient was seen by Dr. Mayford KnifeWilliams. Please see his history and physical for further detail and background. He asked me to follow-up on her urinalysis and pregnancy test. Those have now returned and are both negative.  On reexam at 4:40pm, the patient was sleeping but awoke and easily. She was alert and communicative and in no acute distress. She reports she felt ready to go home. With no further immediate issues, we will discharge the patient.  Darien Ramusavid W Kauri Garson, MD 08/27/15 [phone]

## 2015-08-27 NOTE — ED Provider Notes (Signed)
[facility] Emergency Department Provider Note     Time seen: ----------------------------------------- 12:48 PM on 08/27/2015 -----------------------------------------    I have reviewed the triage vital signs and the nursing notes.   HISTORY  Chief Complaint Seizures    HPI Margaret Clark is a 24 y.o. female who presents ER for multiple seizure-like events. According to reports she had 30 second episodes where she was shaking Her what happens. She was awoke and EMS, does take Keppra is being worked up for these at [facility]. Reportedly she has an EEG scheduled for tomorrow. States she is slightly sleeping the pains or headache otherwise denies complaints currently.   Past Medical History  Diagnosis Date  . Seizures (HCC)     There are no active problems to display for this patient.   No past surgical history on file.  Allergies Shellfish allergy  Social History Social History  Substance Use Topics  . Smoking status: Never Smoker   . Smokeless tobacco: Not on file  . Alcohol Use: Yes    Review of Systems Constitutional: Negative for fever. Eyes: Negative for visual changes. ENT: Negative for sore throat. Cardiovascular: Negative for chest pain. Respiratory: Negative for shortness of breath. Gastrointestinal: Negative for abdominal pain, vomiting and diarrhea. Genitourinary: Negative for dysuria. Musculoskeletal: Negative for back pain. Skin: Negative for rash. Neurological: Positive for headache, positive for weakness  10-point ROS otherwise negative.  ____________________________________________   PHYSICAL EXAM:  VITAL SIGNS: ED Triage Vitals  Enc Vitals Group     BP --      Pulse --      Resp --      Temp --      Temp src --      SpO2 --      Weight --      Height --      Head Cir --      Peak Flow --      Pain Score --      Pain Loc --      Pain Edu? --      Excl. in GC? --     Constitutional: Alert  and oriented. Well appearing and in no distress. Eyes: Conjunctivae are normal. PERRL. Normal extraocular movements. ENT   Head: Normocephalic and atraumatic.   Nose: No congestion/rhinnorhea.   Mouth/Throat: Mucous membranes are moist.   Neck: No stridor. Cardiovascular: Normal rate, regular rhythm. Normal and symmetric distal pulses are present in all extremities. No murmurs, rubs, or gallops. Respiratory: Normal respiratory effort without tachypnea nor retractions. Breath sounds are clear and equal bilaterally. No wheezes/rales/rhonchi. Gastrointestinal: Soft and nontender. No distention. No abdominal bruits.  Musculoskeletal: Nontender with normal range of motion in all extremities. No joint effusions.  No lower extremity tenderness nor edema. Neurologic:  Normal speech and language. No gross focal neurologic deficits are appreciated. Speech is normal. No gait instability. Skin:  Skin is warm, dry and intact. No rash noted. Psychiatric: Mood and affect are normal. Speech and behavior are normal. Patient exhibits appropriate insight and judgment. ____________________________________________  ED COURSE:  Pertinent labs & imaging results that were available during my care of the patient were reviewed by me and considered in my medical decision making (see chart for details). Possible seizures, unclear etiology. Will review workup at [facility] and reevaluate. ____________________________________________    LABS (pertinent positives/negatives)  Labs Reviewed  CBC WITH DIFFERENTIAL/PLATELET - Abnormal; Notable for the following:    Hemoglobin 9.3 (*)  HCT 29.6 (*)    MCV 70.9 (*)    MCH 22.2 (*)    MCHC 31.3 (*)    RDW 18.1 (*)    All other components within normal limits  COMPREHENSIVE METABOLIC PANEL  PREGNANCY, URINE  URINALYSIS COMPLETEWITH MICROSCOPIC ([facility] ONLY)     ____________________________________________  FINAL ASSESSMENT AND  PLAN  Seizures  Plan: Patient with labs and imaging as dictated above. Patient be advised to take 3 Keppra at night and 2 in the morning. She is advised to [facility].   Emily FilbertWilliams, Neshawn Aird E, MD   Emily FilbertJonathan E Chick Cousins, MD 08/27/15 [phone]

## 2015-08-27 NOTE — Discharge Instructions (Signed)

## 2015-08-27 NOTE — ED Notes (Signed)
Pt discharged home after verbalizing understanding of discharge instructions; nad noted. 

## 2015-08-27 NOTE — ED Notes (Signed)
Pt stated that if she needs to be admitted, she wants to go to [facility]

## 2015-08-27 NOTE — ED Notes (Signed)
Pt from work via [employer]; fellow employees state she had 6 seizures lasting 30 seconds each. Pt reports that she can't remember what happens and that she awakened to presence of EMS.Pt takes Keppra and is being [facility] by [facility] for the same. Pt slightly sleepy and c/o headache, otherwise no complaints.

## 2015-09-17 ENCOUNTER — Emergency Department
Admission: EM | Admit: 2015-09-17 | Discharge: 2015-09-17 | Disposition: A | Discharge status: Home / Self Care | Payer: No Typology Code available for payment source | Attending: Emergency Medicine | Admitting: Emergency Medicine

## 2015-09-17 ENCOUNTER — Encounter: Payer: Self-pay | Admitting: Medical Oncology

## 2015-09-17 DIAGNOSIS — R51 Headache: Secondary | ICD-10-CM | POA: Diagnosis not present

## 2015-09-17 DIAGNOSIS — R110 Nausea: Secondary | ICD-10-CM | POA: Diagnosis not present

## 2015-09-17 DIAGNOSIS — Z79899 Other long term (current) drug therapy: Secondary | ICD-10-CM | POA: Insufficient documentation

## 2015-09-17 DIAGNOSIS — R519 Headache, unspecified: Secondary | ICD-10-CM

## 2015-09-17 DIAGNOSIS — Z791 Long term (current) use of non-steroidal anti-inflammatories (NSAID): Secondary | ICD-10-CM | POA: Diagnosis not present

## 2015-09-17 HISTORY — DX: Epilepsy, unspecified, not intractable, without status epilepticus: G40.909

## 2015-09-17 MED ORDER — BUTALBITAL-APAP-CAFFEINE 50-325-40 MG PO TABS: 1.0000 | ORAL_TABLET | Freq: Four times a day (QID) | ORAL | Status: DC | PRN

## 2015-09-17 MED ORDER — DIPHENHYDRAMINE HCL 50 MG/ML IJ SOLN
25.0000 mg | Freq: Once | INTRAMUSCULAR | Status: AC
  Administered 2015-09-17: 25 mg via INTRAVENOUS
  Filled 2015-09-17: qty 1

## 2015-09-17 MED ORDER — SODIUM CHLORIDE 0.9 % IV BOLUS (SEPSIS)
1000.0000 mL | Freq: Once | INTRAVENOUS | Status: AC
  Administered 2015-09-17: 1000 mL via INTRAVENOUS

## 2015-09-17 MED ORDER — PROCHLORPERAZINE EDISYLATE 5 MG/ML IJ SOLN
10.0000 mg | Freq: Once | INTRAMUSCULAR | Status: AC
  Administered 2015-09-17: 10 mg via INTRAVENOUS
  Filled 2015-09-17: qty 2

## 2015-09-17 NOTE — ED Provider Notes (Signed)
[facility] Emergency Department Provider Note  ____________________________________________  Time seen: Approximately 1030 AM  I have reviewed the triage vital signs and the nursing notes.   HISTORY  Chief Complaint Headache    HPI Margaret Clark is a 25 y.o. female with a history of headaches who is presenting today with a diffuse headache since last night. She says that she has had headaches intermittently over the past year and this feels similar. She says the headache started suddenly. She says there is a mild degree of nausea. However, no vomiting, no photophobia or phonophobia. She does not have an instigating factor for the headaches. Denies any history of cerebral aneurysm in her family but she does say that there is a history of multiple people with migraines. She denies any neck pain. Patient has taken Aleve at home with minimal relief.    Past Medical History  Diagnosis Date  . Seizures (HCC)   . Epilepsia (HCC)     There are no active problems to display for this patient.   History reviewed. No pertinent past surgical history.  Current Outpatient Rx  Name  Route  Sig  Dispense  Refill  . cyclobenzaprine (FLEXERIL) 10 MG tablet   Oral   Take 1 tablet (10 mg total) by mouth 3 (three) times daily as needed for muscle spasms.   30 tablet   0   . diazepam (VALIUM) 2 MG tablet   Oral   Take 1 tablet (2 mg total) by mouth every 8 (eight) hours as needed for anxiety or muscle spasms.   20 tablet   0   . etodolac (LODINE) 200 MG capsule   Oral   Take 1 capsule (200 mg total) by mouth every 8 (eight) hours.   12 capsule   0   . levETIRAcetam (KEPPRA) 500 MG tablet   Oral   Take 500 mg by mouth 4 (four) times daily.         . naproxen (NAPROSYN) 500 MG tablet   Oral   Take 1 tablet (500 mg total) by mouth 2 (two) times daily with a meal.   60 tablet   2   . ondansetron (ZOFRAN) 4 MG tablet   Oral   Take 1 tablet (4 mg  total) by mouth daily as needed for nausea or vomiting.   8 tablet   0     Allergies Shellfish allergy  No family history on file.  Social History Social History  Substance Use Topics  . Smoking status: Never Smoker   . Smokeless tobacco: Never Used  . Alcohol Use: No    Review of Systems Constitutional: No fever/chills Eyes: No visual changes. ENT: No sore throat. Cardiovascular: Denies chest pain. Respiratory: Denies shortness of breath. Gastrointestinal: No abdominal pain.  No nausea, no vomiting.  No diarrhea.  No constipation. Genitourinary: Negative for dysuria. Musculoskeletal: Negative for back pain. Skin: Negative for rash. Neurological: Negative for focal weakness or numbness.  10-point ROS otherwise negative.  ____________________________________________   PHYSICAL EXAM:  VITAL SIGNS: ED Triage Vitals  Enc Vitals Group     BP 09/17/15 0959 108/61 mmHg     Pulse Rate 09/17/15 0959 85     Resp 09/17/15 0959 18     Temp 09/17/15 0959 98.6 F (37 C)     Temp Source 09/17/15 0959 Oral     SpO2 09/17/15 0959 100 %     Weight 09/17/15 0959 245 lb (111.131 kg)  Height 09/17/15 0959 5\' 4"  (1.626 m)     Head Cir --      Peak Flow --      Pain Score 09/17/15 1000 8     Pain Loc --      Pain Edu? --      Excl. in GC? --     Constitutional: Alert and oriented. Well appearing and in no acute distress. Eyes: Conjunctivae are normal. PERRL. EOMI. Head: Atraumatic. Nose: No congestion/rhinnorhea. Mouth/Throat: Mucous membranes are moist.   Neck: No stridor.  No meningismus Cardiovascular: Normal rate, regular rhythm. Grossly normal heart sounds.  Good peripheral circulation. Respiratory: Normal respiratory effort.  No retractions. Lungs CTAB. Gastrointestinal: Soft and nontender. No distention. No abdominal bruits. No CVA tenderness. Musculoskeletal: No lower extremity tenderness nor edema.  No joint effusions. Neurologic:  Normal speech and language.  No gross focal neurologic deficits are appreciated. No gait instability. Skin:  Skin is warm, dry and intact. No rash noted. Psychiatric: Mood and affect are normal. Speech and behavior are normal.  ____________________________________________   LABS (all labs ordered are listed, but only abnormal results are displayed)  Labs Reviewed - No data to display ____________________________________________  EKG   ____________________________________________  RADIOLOGY   ____________________________________________   PROCEDURES    ____________________________________________   INITIAL IMPRESSION / ASSESSMENT AND PLAN / ED COURSE  Pertinent labs & imaging results that were available during my care of the patient were reviewed by me and considered in my medical decision making (see chart for details).  Patient with headache that was sudden onset but with no distress on exam. No meningismus. Multiple headaches like this in the past. Does not appear clinically to be a subarachnoid hemorrhage at this time. We'll treat with migraine treatments and reevaluate.  ----------------------------------------- 1:08 PM on 09/17/2015 -----------------------------------------  Patient is resting comfortably and is now completely headache free. She'll be discharged home. I'll discharge her home with [facility]. She says she'll be able to follow-up with [place] neurolog[place] in [place]. She will continue taking her Keppra as prescribed. Suspect that this is an acute exacerbation of her ongoing headaches. ____________________________________________   FINAL CLINICAL IMPRESSION(S) / ED DIAGNOSES  Acute on chronic headache.    Myrna Blazeravid Matthew Champ Keetch, MD 09/17/15 [phone]

## 2015-09-17 NOTE — ED Notes (Signed)
Pt reports headache that began last night with nausea.

## 2015-09-17 NOTE — Discharge Instructions (Signed)

## 2015-10-17 ENCOUNTER — Emergency Department
Admission: EM | Admit: 2015-10-17 | Discharge: 2015-10-17 | Disposition: A | Discharge status: Home / Self Care | Payer: No Typology Code available for payment source | Attending: Emergency Medicine | Admitting: Emergency Medicine

## 2015-10-17 ENCOUNTER — Encounter: Payer: Self-pay | Admitting: Medical Oncology

## 2015-10-17 DIAGNOSIS — Z791 Long term (current) use of non-steroidal anti-inflammatories (NSAID): Secondary | ICD-10-CM | POA: Insufficient documentation

## 2015-10-17 DIAGNOSIS — S3992XA Unspecified injury of lower back, initial encounter: Secondary | ICD-10-CM | POA: Diagnosis present

## 2015-10-17 DIAGNOSIS — S300XXA Contusion of lower back and pelvis, initial encounter: Secondary | ICD-10-CM | POA: Diagnosis not present

## 2015-10-17 DIAGNOSIS — W07XXXA Fall from chair, initial encounter: Secondary | ICD-10-CM | POA: Diagnosis not present

## 2015-10-17 DIAGNOSIS — G40909 Epilepsy, unspecified, not intractable, without status epilepticus: Secondary | ICD-10-CM | POA: Diagnosis not present

## 2015-10-17 DIAGNOSIS — Y9289 Other specified places as the place of occurrence of the external cause: Secondary | ICD-10-CM | POA: Diagnosis not present

## 2015-10-17 DIAGNOSIS — Y998 Other external cause status: Secondary | ICD-10-CM | POA: Insufficient documentation

## 2015-10-17 DIAGNOSIS — Y9389 Activity, other specified: Secondary | ICD-10-CM | POA: Insufficient documentation

## 2015-10-17 DIAGNOSIS — Z79899 Other long term (current) drug therapy: Secondary | ICD-10-CM | POA: Insufficient documentation

## 2015-10-17 DIAGNOSIS — S20221A Contusion of right back wall of thorax, initial encounter: Secondary | ICD-10-CM

## 2015-10-17 MED ORDER — CYCLOBENZAPRINE HCL 5 MG PO TABS
5.0000 mg | ORAL_TABLET | Freq: Three times a day (TID) | ORAL | Status: DC | PRN
Start: 2015-10-17 — End: 2015-11-16

## 2015-10-17 NOTE — ED Provider Notes (Signed)
[facility] Emergency Department Provider Note ____________________________________________  Time seen: 1542  I have reviewed the triage vital signs and the nursing notes.  HISTORY  Chief Complaint  Back Pain  HPI Margaret Clark is a 24 y.o. female ports to the ED for evaluation of back pain following which she describes as a witness seizure yesterday. She describes a seizure where she apparently fell out of the chair she was sitting. She reports awakening following the seizure the next day, with pain to the left lower back. She denies any head injury, laceration, or abrasion. She claims she is tender to touch over the left low back and denies any distal paresthesias or bladder incontinence.  Past Medical History  Diagnosis Date  . Seizures (HCC)   . Epilepsia (HCC)     There are no active problems to display for this patient.  History reviewed. No pertinent past surgical history.  Current Outpatient Rx  Name  Route  Sig  Dispense  Refill  . butalbital-acetaminophen-caffeine (FIORICET) 50-325-40 MG tablet   Oral   Take 1-2 tablets by mouth every 6 (six) hours as needed for headache.   20 tablet   0   . cyclobenzaprine (FLEXERIL) 5 MG tablet   Oral   Take 1 tablet (5 mg total) by mouth 3 (three) times daily as needed for muscle spasms.   15 tablet   0   . diazepam (VALIUM) 2 MG tablet   Oral   Take 1 tablet (2 mg total) by mouth every 8 (eight) hours as needed for anxiety or muscle spasms.   20 tablet   0   . etodolac (LODINE) 200 MG capsule   Oral   Take 1 capsule (200 mg total) by mouth every 8 (eight) hours.   12 capsule   0   . ibuprofen (ADVIL,MOTRIN) 200 MG tablet   Oral   Take 400 mg by mouth every 6 (six) hours as needed.         . levETIRAcetam (KEPPRA) 500 MG tablet   Oral   Take 500 mg by mouth 2 (two) times daily.          . naproxen (NAPROSYN) 500 MG tablet   Oral   Take 1 tablet (500 mg total) by mouth 2 (two)  times daily with a meal.   60 tablet   2   . ondansetron (ZOFRAN) 4 MG tablet   Oral   Take 1 tablet (4 mg total) by mouth daily as needed for nausea or vomiting.   8 tablet   0    Allergies Shellfish allergy  No family history on file.  Social History Social History  Substance Use Topics  . Smoking status: Never Smoker   . Smokeless tobacco: Never Used  . Alcohol Use: No   Review of Systems  Constitutional: Negative for fever. Eyes: Negative for visual changes. ENT: Negative for sore throat. Cardiovascular: Negative for chest pain. Respiratory: Negative for shortness of breath. Gastrointestinal: Negative for abdominal pain, vomiting and diarrhea. Genitourinary: Negative for dysuria. Musculoskeletal: Positive for left sided back pain. Skin: Negative for rash. Neurological: Negative for headaches, focal weakness or numbness. ____________________________________________  PHYSICAL EXAM:  VITAL SIGNS: ED Triage Vitals  Enc Vitals Group     BP 10/17/15 1438 122/71 mmHg     Pulse Rate 10/17/15 1438 84     Resp 10/17/15 1438 18     Temp 10/17/15 1438 98.4 F (36.9 C)     Temp Source  10/17/15 1438 Oral     SpO2 10/17/15 1438 99 %     Weight 10/17/15 1438 245 lb (111.131 kg)     Height 10/17/15 1438  (1.6 m)     Head Cir --      Peak Flow --      Pain Score 10/17/15 1438 9     Pain Loc --      Pain Edu? --      Excl. in GC? --    Constitutional: Alert and oriented. Well appearing and in no distress. Head: Normocephalic and atraumatic.      Eyes: Conjunctivae are normal. PERRL. Normal extraocular movements      Ears: Canals clear. TMs intact bilaterally.   Nose: No congestion/rhinorrhea.   Mouth/Throat: Mucous membranes are moist.   Neck: Supple. No thyromegaly. Hematological/Lymphatic/Immunological: No cervical lymphadenopathy. Cardiovascular: Normal rate, regular rhythm.  Respiratory: Normal respiratory effort. No  wheezes/rales/rhonchi. Gastrointestinal: Soft and nontender. No distention. Musculoskeletal: Normal spinal alignment without midline tenderness, spasm, deformity, step-off. Patient is mildly tender to palpation over the left SI joint. She demonstrates a negative straight leg raise and transitions from sit to stand without difficulty. Nontender with normal range of motion in all extremities.  Neurologic:  Cranial nerves II through XII grossly intact. Normal LE DTRs bilaterally. Normal gait without ataxia. Normal speech and language. No gross focal neurologic deficits are appreciated. Skin:  Skin is warm, dry and intact. No rash noted. Psychiatric: Mood and affect are normal. Patient exhibits appropriate insight and judgment. ____________________________________________  INITIAL IMPRESSION / ASSESSMENT AND PLAN / ED COURSE  Patient with normal exam and consistent symptoms supporting the diagnosis of back contusion. She is discharged with prescription for Flexeril dose as directed. She is also advised dose over-the-counter Tylenol or Motrin for non-drowsy symptom relief. She'll be discharged up with primary care provider as discussed. Work note is provided for today as requested. ____________________________________________  FINAL CLINICAL IMPRESSION(S) / ED DIAGNOSES  Final diagnoses:  Back contusion, right, initial encounter      Lissa Hoard, PA-C 10/17/15 2324  Jeanmarie Plant, MD 10/17/15 2352

## 2015-10-17 NOTE — ED Notes (Signed)
Having pain to back s/p fall after having a sz yesterday had some recent sz med changes   Denies any other sx at present

## 2015-10-17 NOTE — ED Notes (Signed)
Pt reports she had seizure last night and fell off the couch, reports since then she has been having left lower back pain, denies hitting head, has hx of seizures. Last seizure was about 5 months ago after pt had been off meds for a while and neurogist just placed her on new meds.

## 2015-10-17 NOTE — Discharge Instructions (Signed)
Contusion A contusion is a deep bruise. Contusions happen when an injury causes bleeding under the skin. Symptoms of bruising include pain, swelling, and discolored skin. The skin may turn blue, purple, or yellow. HOME CARE   Rest the injured area.  If told, put ice on the injured area.  Put ice in a plastic bag.  Place a towel between your skin and the bag.  Leave the ice on for 20 minutes, 2-3 times per day.  If told, put light pressure (compression) on the injured area using an elastic bandage. Make sure the bandage is not too tight. Remove it and put it back on as told by your doctor.  If possible, raise (elevate) the injured area above the level of your heart while you are sitting or lying down.  Take over-the-counter and prescription medicines only as told by your doctor. GET HELP IF:  Your symptoms do not get better after several days of treatment.  Your symptoms get worse.  You have trouble moving the injured area. GET HELP RIGHT AWAY IF:   You have very bad pain.  You have a loss of feeling (numbness) in a hand or foot.  Your hand or foot turns pale or cold.   This information is not intended to replace advice given to you by your health care provider. Make sure you discuss any questions you have with your health care provider.   Document Released: 03/24/2008 Document Revised: 06/27/2015 Document Reviewed: 02/21/2015 Elsevier Interactive Patient Education 2016 [place] Injury Prevention Back injuries can be very painful. They can also be difficult to heal. After having one back injury, you are more likely to injure your back again. It is important to learn how to avoid injuring or re-injuring your back. The following tips can help you to prevent a back injury. WHAT SHOULD I KNOW ABOUT PHYSICAL FITNESS?  Exercise for 30 minutes per day on most days of the week or as told by your doctor. Make sure to:  Do aerobic exercises, such as walking, jogging,  biking, or swimming.  Do exercises that increase balance and strength, such as tai chi and yoga.  Do stretching exercises. This helps with flexibility.  Try to develop strong belly (abdominal) muscles. Your belly muscles help to support your back.  Stay at a healthy weight. This helps to decrease your risk of a back injury. WHAT SHOULD I KNOW ABOUT MY DIET?  Talk with your doctor about your overall diet. Take supplements and vitamins only as told by your doctor.  Talk with your doctor about how much calcium and vitamin D you need each day. These nutrients help to prevent weakening of the bones (osteoporosis).  Include good sources of calcium in your diet, such as:  Dairy products.  Green leafy vegetables.  Products that have had calcium added to them (fortified).  Include good sources of vitamin D in your diet, such as:  Milk.  Foods that have had vitamin D added to them. WHAT SHOULD I KNOW ABOUT MY POSTURE?  Sit up straight and stand up straight. Avoid leaning forward when you sit or hunching over when you stand.  Choose chairs that have good low-back (lumbar) support.  If you work at a desk, sit close to it so you do not need to lean over. Keep your chin tucked in. Keep your neck drawn back. Keep your elbows bent so your arms look like the letter "L" (right angle).  Sit high and close to the steering  wheel when you drive. Add a low-back support to your car seat, if needed.  Avoid sitting or standing in one position for very long. Take breaks to get up, stretch, and walk around at least one time every hour. Take breaks every hour if you are driving for long periods of time.  Sleep on your side with your knees slightly bent, or sleep on your back with a pillow under your knees. Do not lie on the front of your body to sleep. WHAT SHOULD I KNOW ABOUT LIFTING, TWISTING, AND REACHING Lifting and Heavy Lifting  Avoid heavy lifting, especially lifting over and over again. If  you must do heavy lifting:  Stretch before lifting.  Work slowly.  Rest between lifts.  Use a tool such as a cart or a dolly to move objects if one is available.  Make several small trips instead of carrying one heavy load.  Ask for help when you need it, especially when moving big objects.  Follow these steps when lifting:  Stand with your feet shoulder-width apart.  Get as close to the object as you can. Do not pick up a heavy object that is far from your body.  Use handles or lifting straps if they are available.  Bend at your knees. Squat down, but keep your heels off the floor.  Keep your shoulders back. Keep your chin tucked in. Keep your back straight.  Lift the object slowly while you tighten the muscles in your legs, belly, and butt. Keep the object as close to the center of your body as possible.  Follow these steps when putting down a heavy load:  Stand with your feet shoulder-width apart.  Lower the object slowly while you tighten the muscles in your legs, belly, and butt. Keep the object as close to the center of your body as possible.  Keep your shoulders back. Keep your chin tucked in. Keep your back straight.  Bend at your knees. Squat down, but keep your heels off the floor.  Use handles or lifting straps if they are available. Twisting and Reaching  Avoid lifting heavy objects above your waist.  Do not twist at your waist while you are lifting or carrying a load. If you need to turn, move your feet.  Do not bend over without bending at your knees.  Avoid reaching over your head, across a table, or for an object on a high surface.  WHAT ARE SOME OTHER TIPS?  Avoid wet floors and icy ground. Keep sidewalks clear of ice to prevent falls.   Do not sleep on a mattress that is too soft or too hard.   Keep items that you use often within easy reach.   Put heavier objects on shelves at waist level, and put lighter objects on lower or higher  shelves.  Find ways to lower your stress, such as:  Exercise.  Massage.  Relaxation techniques.  Talk with your doctor if you feel anxious or depressed. These conditions can make back pain worse.  Wear flat heel shoes with cushioned soles.  Avoid making quick (sudden) movements.  Use both shoulder straps when carrying a backpack.  Do not use any tobacco products, including cigarettes, chewing tobacco, or electronic cigarettes. If you need help quitting, ask your doctor.   This information is not intended to replace advice given to you by your health care provider. Make sure you discuss any questions you have with your health care provider.   Document Released: 03/24/2008 Document Revised:  02/20/2015 Document Reviewed: 10/10/2014 Elsevier Interactive Patient Education 2016 Onset the prescription muscle relaxant as needed. Follow-up with [facility] as needed.

## 2015-10-30 ENCOUNTER — Encounter: Payer: Self-pay | Admitting: *Deleted

## 2015-10-30 ENCOUNTER — Emergency Department
Admission: EM | Admit: 2015-10-30 | Discharge: 2015-10-30 | Discharge status: Against Medical Advice | Payer: No Typology Code available for payment source | Attending: Emergency Medicine | Admitting: Emergency Medicine

## 2015-10-30 DIAGNOSIS — G40909 Epilepsy, unspecified, not intractable, without status epilepticus: Secondary | ICD-10-CM | POA: Insufficient documentation

## 2015-10-30 NOTE — ED Notes (Addendum)
Pt states seizure at work, hx of seizures, states she is on keppra BID, pt awake and alert upon arrival, denies any bowel or bladder incontience, states she recently started back on her keppra last month, states she had another seizure last night, pt states she has been missing some days with her meds, states she has been under a lot of stress recently, pt states before last night she can not remember the last seizure she had

## 2015-10-30 NOTE — ED Notes (Signed)
Pt states she does not want to get her labs drawn and does not want to wait to see the dr.  Pt advised she should stay for evaluation but pt refuses.

## 2015-10-31 ENCOUNTER — Telehealth: Payer: Self-pay | Admitting: Emergency Medicine

## 2015-10-31 NOTE — ED Notes (Signed)
Called patient due to lwot to inquire about condition and follow up plans. Left message with my number. 

## 2015-11-16 ENCOUNTER — Encounter: Payer: Self-pay | Admitting: Emergency Medicine

## 2015-11-16 ENCOUNTER — Emergency Department
Admission: EM | Admit: 2015-11-16 | Discharge: 2015-11-16 | Disposition: A | Discharge status: Home / Self Care | Payer: No Typology Code available for payment source | Attending: Emergency Medicine | Admitting: Emergency Medicine

## 2015-11-16 DIAGNOSIS — X58XXXA Exposure to other specified factors, initial encounter: Secondary | ICD-10-CM | POA: Insufficient documentation

## 2015-11-16 DIAGNOSIS — S39012A Strain of muscle, fascia and tendon of lower back, initial encounter: Secondary | ICD-10-CM

## 2015-11-16 DIAGNOSIS — R569 Unspecified convulsions: Secondary | ICD-10-CM | POA: Insufficient documentation

## 2015-11-16 DIAGNOSIS — Z79899 Other long term (current) drug therapy: Secondary | ICD-10-CM | POA: Insufficient documentation

## 2015-11-16 DIAGNOSIS — Y998 Other external cause status: Secondary | ICD-10-CM | POA: Insufficient documentation

## 2015-11-16 DIAGNOSIS — Y9389 Activity, other specified: Secondary | ICD-10-CM | POA: Insufficient documentation

## 2015-11-16 DIAGNOSIS — Y9289 Other specified places as the place of occurrence of the external cause: Secondary | ICD-10-CM | POA: Insufficient documentation

## 2015-11-16 MED ORDER — DIAZEPAM 2 MG PO TABS: 2.0000 mg | ORAL_TABLET | Freq: Three times a day (TID) | ORAL | Status: DC | PRN

## 2015-11-16 MED ORDER — NAPROXEN 500 MG PO TABS: 500.0000 mg | ORAL_TABLET | Freq: Two times a day (BID) | ORAL | Status: DC

## 2015-11-16 NOTE — ED Provider Notes (Signed)
[facility] Emergency Department Provider Note ____________________________________________  Time seen: Approximately 3:40 PM  I have reviewed the triage vital signs and the nursing notes.   HISTORY  Chief Complaint Back Pain    HPI Margaret Clark is a 25 y.o. female who presents to the emergency department for evaluation of lower back pain. She states she had a seizure yesterday and feels like she "pulled something" in her left lower back. She states that she is taking her seizure medication as prescribed and is following closely with her neurologist. She states that her seizures are becoming fewer and fewer. She has a follow up with neurology on November 30, 2015. Today she is here only for evaluation of the pain in her lower back. She has not taken anything for pain. She had to leave work today because it is painful to walk. She denies bowel or bladder dysfunction related to back pain.  Past Medical History  Diagnosis Date  . Seizures (HCC)   . Epilepsia (HCC)     There are no active problems to display for this patient.   History reviewed. No pertinent past surgical history.  Current Outpatient Rx  Name  Route  Sig  Dispense  Refill  . butalbital-acetaminophen-caffeine (FIORICET) 50-325-40 MG tablet   Oral   Take 1-2 tablets by mouth every 6 (six) hours as needed for headache.   20 tablet   0   . cyclobenzaprine (FLEXERIL) 5 MG tablet   Oral   Take 1 tablet (5 mg total) by mouth 3 (three) times daily as needed for muscle spasms.   15 tablet   0   . diazepam (VALIUM) 2 MG tablet   Oral   Take 1 tablet (2 mg total) by mouth every 8 (eight) hours as needed for anxiety or muscle spasms.   20 tablet   0   . etodolac (LODINE) 200 MG capsule   Oral   Take 1 capsule (200 mg total) by mouth every 8 (eight) hours.   12 capsule   0   . ibuprofen (ADVIL,MOTRIN) 200 MG tablet   Oral   Take 400 mg by mouth every 6 (six) hours as needed.          . levETIRAcetam (KEPPRA) 500 MG tablet   Oral   Take 500 mg by mouth 2 (two) times daily.          . naproxen (NAPROSYN) 500 MG tablet   Oral   Take 1 tablet (500 mg total) by mouth 2 (two) times daily with a meal.   60 tablet   2   . ondansetron (ZOFRAN) 4 MG tablet   Oral   Take 1 tablet (4 mg total) by mouth daily as needed for nausea or vomiting.   8 tablet   0     Allergies Shellfish allergy  No family history on file.  Social History Social History  Substance Use Topics  . Smoking status: Never Smoker   . Smokeless tobacco: Never Used  . Alcohol Use: No    Review of Systems Constitutional: No recent illness. Cardiovascular: Denies chest pain or palpitations. Respiratory: Denies shortness of breath. Gastrointestinal: No abdominal pain.  Genitourinary: Negative for dysuria. Musculoskeletal: Pain in left lower back. Skin: Negative for rash. Neurological: Negative for headaches, focal weakness or numbness. 10-point ROS otherwise unremarkable.  ____________________________________________   PHYSICAL EXAM:  VITAL SIGNS: ED Triage Vitals  Enc Vitals Group     BP 11/16/15 1524 120/73 mmHg  Pulse Rate 11/16/15 1524 84     Resp 11/16/15 1524 20     Temp 11/16/15 1524 98 F (36.7 C)     Temp Source 11/16/15 1524 Oral     SpO2 11/16/15 1524 98 %     Weight 11/16/15 1524 245 lb (111.131 kg)     Height 11/16/15 1524  (1.626 m)     Head Cir --      Peak Flow --      Pain Score 11/16/15 1513 7     Pain Loc --      Pain Edu? --      Excl. in GC? --     Constitutional: Alert and oriented. Well appearing and in no acute distress. Eyes: Conjunctivae are normal. EOMI. Head: Atraumatic. Nose: No congestion/rhinnorhea. Neck: No stridor.  Respiratory: Normal respiratory effort.   Musculoskeletal: No CVA tenderness. Pain reproducible with movement to right and flexion. Neurologic:  Normal speech and language. No gross focal neurologic deficits  are appreciated. Speech is normal. No gait instability. Skin:  Skin is warm, dry and intact. Atraumatic. No ecchymosis. Psychiatric: Mood and affect are normal. Speech and behavior are normal.  ____________________________________________   LABS (all labs ordered are listed, but only abnormal results are displayed)  Labs Reviewed - No data to display ____________________________________________  RADIOLOGY   ____________________________________________   PROCEDURES  Procedure(s) performed: None   ____________________________________________   INITIAL IMPRESSION / ASSESSMENT AND PLAN / ED COURSE  Pertinent labs & imaging results that were available during my care of the patient were reviewed by me and considered in my medical decision making (see chart for details).  Will treat with diazepam for muscle spasm and hopefully reduce seizure activity as well. She is to follow up with the neurologist as scheduled. She was advised to return to the ER for symptoms that change or worsen if unable to see PCP or specialist sooner. ____________________________________________   FINAL CLINICAL IMPRESSION(S) / ED DIAGNOSES  Final diagnoses:  None       Chinita Pester, FNP 11/16/15 1649  Jeanmarie Plant, MD 11/16/15 1731

## 2015-11-16 NOTE — ED Notes (Signed)
States she thinks she may have twisted her back during a sz yesterday ambulates well to treatment room

## 2015-11-16 NOTE — ED Notes (Signed)
States she had a sz yesterday now having back pain

## 2015-12-14 ENCOUNTER — Emergency Department: Payer: No Typology Code available for payment source

## 2015-12-14 ENCOUNTER — Encounter: Payer: Self-pay | Admitting: Emergency Medicine

## 2015-12-14 ENCOUNTER — Emergency Department
Admission: EM | Admit: 2015-12-14 | Discharge: 2015-12-14 | Disposition: A | Discharge status: Home / Self Care | Payer: No Typology Code available for payment source | Attending: Emergency Medicine | Admitting: Emergency Medicine

## 2015-12-14 DIAGNOSIS — Y990 Civilian activity done for income or pay: Secondary | ICD-10-CM | POA: Insufficient documentation

## 2015-12-14 DIAGNOSIS — W1839XA Other fall on same level, initial encounter: Secondary | ICD-10-CM | POA: Insufficient documentation

## 2015-12-14 DIAGNOSIS — G40909 Epilepsy, unspecified, not intractable, without status epilepticus: Secondary | ICD-10-CM | POA: Insufficient documentation

## 2015-12-14 DIAGNOSIS — Y9389 Activity, other specified: Secondary | ICD-10-CM | POA: Insufficient documentation

## 2015-12-14 DIAGNOSIS — S93401A Sprain of unspecified ligament of right ankle, initial encounter: Secondary | ICD-10-CM | POA: Insufficient documentation

## 2015-12-14 DIAGNOSIS — Y9289 Other specified places as the place of occurrence of the external cause: Secondary | ICD-10-CM | POA: Insufficient documentation

## 2015-12-14 DIAGNOSIS — Z791 Long term (current) use of non-steroidal anti-inflammatories (NSAID): Secondary | ICD-10-CM | POA: Insufficient documentation

## 2015-12-14 DIAGNOSIS — Z79899 Other long term (current) drug therapy: Secondary | ICD-10-CM | POA: Insufficient documentation

## 2015-12-14 MED ORDER — IBUPROFEN 800 MG PO TABS
800.0000 mg | ORAL_TABLET | Freq: Once | ORAL | Status: AC
  Administered 2015-12-14: 800 mg via ORAL
  Filled 2015-12-14: qty 1

## 2015-12-14 MED ORDER — IBUPROFEN 800 MG PO TABS: 800.0000 mg | ORAL_TABLET | Freq: Three times a day (TID) | ORAL | Status: DC | PRN

## 2015-12-14 MED ORDER — OXYCODONE-ACETAMINOPHEN 5-325 MG PO TABS: 1.0000 | ORAL_TABLET | Freq: Four times a day (QID) | ORAL | Status: DC | PRN

## 2015-12-14 NOTE — ED Notes (Signed)
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  C/O right ankle pain.  Patient is AAOx3.  Skin warm and dry.  Moving all extremities equally and strong.  NAD.  Patient states she has been taking medications but that the strobe lights during the fire alarm testing caused the seizure.

## 2015-12-14 NOTE — ED Provider Notes (Signed)
[facility] Emergency Department Provider Note  ____________________________________________  Time seen: Approximately 5:39 PM  I have reviewed the triage vital signs and the nursing notes.   HISTORY  Chief Complaint Ankle Injury    HPI Margaret Clark is a 25 y.o. female with history of seizure disorder who suffered a seizure earlier today around 3 PM. She believes it was triggered by strobe lights at her working facility.During the seizure she fell, and injured her right ankle. She presents with pain and difficulty walking. Minimal swelling.   Past Medical History  Diagnosis Date  . Seizures (HCC)   . Epilepsia (HCC)     There are no active problems to display for this patient.   History reviewed. No pertinent past surgical history.  Current Outpatient Rx  Name  Route  Sig  Dispense  Refill  . butalbital-acetaminophen-caffeine (FIORICET) 50-325-40 MG tablet   Oral   Take 1-2 tablets by mouth every 6 (six) hours as needed for headache.   20 tablet   0   . diazepam (VALIUM) 2 MG tablet   Oral   Take 1 tablet (2 mg total) by mouth every 8 (eight) hours as needed.   20 tablet   0   . ibuprofen (ADVIL,MOTRIN) 800 MG tablet   Oral   Take 1 tablet (800 mg total) by mouth every 8 (eight) hours as needed.   15 tablet   0   . levETIRAcetam (KEPPRA) 500 MG tablet   Oral   Take 500 mg by mouth 2 (two) times daily.          . naproxen (NAPROSYN) 500 MG tablet   Oral   Take 1 tablet (500 mg total) by mouth 2 (two) times daily with a meal.   60 tablet   0   . oxyCODONE-acetaminophen (ROXICET) 5-325 MG tablet   Oral   Take 1 tablet by mouth every 6 (six) hours as needed.   10 tablet   0     Allergies Shellfish allergy  No family history on file.  Social History Social History  Substance Use Topics  . Smoking status: Never Smoker   . Smokeless tobacco: Never Used  . Alcohol Use: No    Review of Systems Constitutional:  No fever/chills Eyes: No visual changes. ENT: No sore throat. Cardiovascular: Denies chest pain. Respiratory: Denies shortness of breath. Gastrointestinal: No abdominal pain.  No nausea, no vomiting.  No diarrhea.  No constipation. Genitourinary: Negative for dysuria. Musculoskeletal: per HPI Skin: Negative for rash. Neurological: Negative for headaches, focal weakness or numbness. 10-point ROS otherwise negative.  ____________________________________________   PHYSICAL EXAM:  VITAL SIGNS: ED Triage Vitals  Enc Vitals Group     BP 12/14/15 1700 106/67 mmHg     Pulse Rate 12/14/15 1658 86     Resp 12/14/15 1658 16     Temp 12/14/15 1658 98.2 F (36.8 C)     Temp Source 12/14/15 1658 Oral     SpO2 12/14/15 1658 97 %     Weight 12/14/15 1658 250 lb (113.399 kg)     Height 12/14/15 1658  (1.626 m)     Head Cir --      Peak Flow --      Pain Score 12/14/15 1659 8     Pain Loc --      Pain Edu? --      Excl. in GC? --     Constitutional: Alert and oriented Cardiovascular: Normal rate, regular  rhythm. Grossly normal heart sounds.  Good peripheral circulation. Respiratory: Normal respiratory effort.  No retractions. Lungs CTAB. Musculoskeletal: pain over the medial malleoli of the right ankle, and navicular region. No lateral tenderness. Limited range of motion, due to pain.  No significant swelling or bruising noted. Neurologic:  Normal speech and language. No gross focal neurologic deficits are appreciated. Cranial nerves II-12 grossly intact. Skin:  Skin is warm, dry and intact. No rash noted. Psychiatric: Mood and affect are normal. Speech and behavior are normal.  ____________________________________________   LABS (all labs ordered are listed, but only abnormal results are displayed)  Labs Reviewed - No data to display ____________________________________________  EKG   ____________________________________________  RADIOLOGY  CLINICAL DATA: Seizure,  fell onto RIGHT foot, RIGHT ankle pain, unable to bear weight due to worsening ankle pain and swelling  EXAM: RIGHT ANKLE - COMPLETE 3+ VIEW  COMPARISON: None  FINDINGS: Osseous mineralization normal.  Joint spaces preserved.  No acute fracture, dislocation or bone destruction.  IMPRESSION: No acute osseous abnormalities.   Electronically Signed  By: Ulyses Southward M.D.  On: 12/14/2015 17:31  ____________________________________________   PROCEDURES  Procedure(s) performed: None  Critical Care performed: No  ____________________________________________   INITIAL IMPRESSION / ASSESSMENT AND PLAN / ED COURSE  Pertinent labs & imaging results that were available during my care of the patient were reviewed by me and considered in my medical decision making (see chart for details).  25 year old with known seizure disorder, who fell during a seizure injuring her right ankle. No fracture seen on x-ray. Treated for ankle sprain with crutches and ace wrap. Also given ibuprofen and Percocet if needed. She can follow-up with the orthopedist if not improving over the next week. She is given handout on exercises. She reports taking her seizure medicine faithfully and has no lingering side effects from the initial seizure. She is followed closely by her neurologist. ____________________________________________   FINAL CLINICAL IMPRESSION(S) / ED DIAGNOSES  Final diagnoses:  Ankle sprain, right, initial encounter  Seizure disorder [facility])      Ignacia Bayley, PA-C 12/14/15 1813  Minna Antis, MD 12/14/15 [phone]

## 2015-12-14 NOTE — ED Notes (Signed)
Discharge instructions reviewed with patient. Patient verbalized understanding. Patient taken to car via wheelchair.

## 2015-12-14 NOTE — Discharge Instructions (Signed)
Acute Ankle Sprain With Phase I Rehab An acute ankle sprain is a partial or complete tear in one or more of the ligaments of the ankle due to traumatic injury. The severity of the injury depends on both the number of ligaments sprained and the grade of sprain. There are 3 grades of sprains.   A grade 1 sprain is a mild sprain. There is a slight pull without obvious tearing. There is no loss of strength, and the muscle and ligament are the correct length.  A grade 2 sprain is a moderate sprain. There is tearing of fibers within the substance of the ligament where it connects two bones or two cartilages. The length of the ligament is increased, and there is usually decreased strength.  A grade 3 sprain is a complete rupture of the ligament and is uncommon. In addition to the grade of sprain, there are three types of ankle sprains.  Lateral ankle sprains: This is a sprain of one or more of the three ligaments on the outer side (lateral) of the ankle. These are the most common sprains. Medial ankle sprains: There is one large triangular ligament of the inner side (medial) of the ankle that is susceptible to injury. Medial ankle sprains are less common. Syndesmosis, "high ankle," sprains: The syndesmosis is the ligament that connects the two bones of the lower leg. Syndesmosis sprains usually only occur with very severe ankle sprains. SYMPTOMS  Pain, tenderness, and swelling in the ankle, starting at the side of injury that may progress to the whole ankle and foot with time.  "Pop" or tearing sensation at the time of injury.  Bruising that may spread to the heel.  Impaired ability to walk soon after injury. CAUSES   Acute ankle sprains are caused by trauma placed on the ankle that temporarily forces or pries the anklebone (talus) out of its normal socket.  Stretching or tearing of the ligaments that normally hold the joint in place (usually due to a twisting injury). RISK INCREASES  WITH:  Previous ankle sprain.  Sports in which the foot may land awkwardly (i.e., basketball, volleyball, or soccer) or walking or running on uneven or rough surfaces.  Shoes with inadequate support to prevent sideways motion when stress occurs.  Poor strength and flexibility.  Poor balance skills.  Contact sports. PREVENTION   Warm up and stretch properly before activity.  Maintain physical fitness:  Ankle and leg flexibility, muscle strength, and endurance.  Cardiovascular fitness.  Balance training activities.  Use proper technique and have a coach correct improper technique.  Taping, protective strapping, bracing, or high-top tennis shoes may help prevent injury. Initially, tape is best; however, it loses most of its support function within 10 to 15 minutes.  Wear proper-fitted protective shoes (High-top shoes with taping or bracing is more effective than either alone).  Provide the ankle with support during sports and practice activities for 12 months following injury. PROGNOSIS   If treated properly, ankle sprains can be expected to recover completely; however, the length of recovery depends on the degree of injury.  A grade 1 sprain usually heals enough in 5 to 7 days to allow modified activity and requires an average of 6 weeks to heal completely.  A grade 2 sprain requires 6 to 10 weeks to heal completely.  A grade 3 sprain requires 12 to 16 weeks to heal.  A syndesmosis sprain often takes more than 3 months to heal. RELATED COMPLICATIONS   Frequent recurrence of symptoms may   result in a chronic problem. Appropriately addressing the problem the first time decreases the frequency of recurrence and optimizes healing time. Severity of the initial sprain does not predict the likelihood of later instability. °· Injury to other structures (bone, cartilage, or tendon). °· A chronically unstable or arthritic ankle joint is a possibility with repeated  sprains. °TREATMENT °Treatment initially involves the use of ice, medication, and compression bandages to help reduce pain and inflammation. Ankle sprains are usually immobilized in a walking cast or boot to allow for healing. Crutches may be recommended to reduce pressure on the injury. After immobilization, strengthening and stretching exercises may be necessary to regain strength and a full range of motion. Surgery is rarely needed to treat ankle sprains. °MEDICATION  °· Nonsteroidal anti-inflammatory medications, such as aspirin and ibuprofen (do not take for the first 3 days after injury or within 7 days before surgery), or other minor pain relievers, such as acetaminophen, are often recommended. Take these as directed by your caregiver. Contact your caregiver immediately if any bleeding, stomach upset, or signs of an allergic reaction occur from these medications. °· Ointments applied to the skin may be helpful. °· Pain relievers may be prescribed as necessary by your caregiver. Do not take prescription pain medication for longer than 4 to 7 days. Use only as directed and only as much as you need. °HEAT AND COLD °· Cold treatment (icing) is used to relieve pain and reduce inflammation for acute and chronic cases. Cold should be applied for 10 to 15 minutes every 2 to 3 hours for inflammation and pain and immediately after any activity that aggravates your symptoms. Use ice packs or an ice massage. °· Heat treatment may be used before performing stretching and strengthening activities prescribed by your caregiver. Use a heat pack or a warm soak. °SEEK IMMEDIATE MEDICAL CARE IF:  °· Pain, swelling, or bruising worsens despite treatment. °· You experience pain, numbness, discoloration, or coldness in the foot or toes. °· New, unexplained symptoms develop (drugs used in treatment may produce side effects.) °EXERCISES  °PHASE I EXERCISES °RANGE OF MOTION (ROM) AND STRETCHING EXERCISES - Ankle Sprain, Acute Phase I,  Weeks 1 to 2 °These exercises may help you when beginning to restore flexibility in your ankle. You will likely work on these exercises for the 1 to 2 weeks after your injury. Once your physician, physical therapist, or athletic trainer sees adequate progress, he or she will advance your exercises. While completing these exercises, remember:  °· Restoring tissue flexibility helps normal motion to return to the joints. This allows healthier, less painful movement and activity. °· An effective stretch should be held for at least 30 seconds. °· A stretch should never be painful. You should only feel a gentle lengthening or release in the stretched tissue. °RANGE OF MOTION - Dorsi/Plantar Flexion °· While sitting with your right / left knee straight, draw the top of your foot upwards by flexing your ankle. Then reverse the motion, pointing your toes downward. °· Hold each position for __________ seconds. °· After completing your first set of exercises, repeat this exercise with your knee bent. °Repeat __________ times. Complete this exercise __________ times per day.  °RANGE OF MOTION - Ankle Alphabet °· Imagine your right / left big toe is a pen. °· Keeping your hip and knee still, write out the entire alphabet with your "pen." Make the letters as large as you can without increasing any discomfort. °Repeat __________ times. Complete this exercise __________   times per day.  °STRENGTHENING EXERCISES - Ankle Sprain, Acute -Phase I, Weeks 1 to 2 °These exercises may help you when beginning to restore strength in your ankle. You will likely work on these exercises for 1 to 2 weeks after your injury. Once your physician, physical therapist, or athletic trainer sees adequate progress, he or she will advance your exercises. While completing these exercises, remember:  °· Muscles can gain both the endurance and the strength needed for everyday activities through controlled exercises. °· Complete these exercises as instructed by  your physician, physical therapist, or athletic trainer. Progress the resistance and repetitions only as guided. °· You may experience muscle soreness or fatigue, but the pain or discomfort you are trying to eliminate should never worsen during these exercises. If this pain does worsen, stop and make certain you are following the directions exactly. If the pain is still present after adjustments, discontinue the exercise until you can discuss the trouble with your clinician. °STRENGTH - Dorsiflexors °· Secure a rubber exercise band/tubing to a fixed object (i.e., table, pole) and loop the other end around your right / left foot. °· Sit on the floor facing the fixed object. The band/tubing should be slightly tense when your foot is relaxed. °· Slowly draw your foot back toward you using your ankle and toes. °· Hold this position for __________ seconds. Slowly release the tension in the band and return your foot to the starting position. °Repeat __________ times. Complete this exercise __________ times per day.  °STRENGTH - Plantar-flexors  °· Sit with your right / left leg extended. Holding onto both ends of a rubber exercise band/tubing, loop it around the ball of your foot. Keep a slight tension in the band. °· Slowly push your toes away from you, pointing them downward. °· Hold this position for __________ seconds. Return slowly, controlling the tension in the band/tubing. °Repeat __________ times. Complete this exercise __________ times per day.  °STRENGTH - Ankle Eversion °· Secure one end of a rubber exercise band/tubing to a fixed object (table, pole). Loop the other end around your foot just before your toes. °· Place your fists between your knees. This will focus your strengthening at your ankle. °· Drawing the band/tubing across your opposite foot, slowly, pull your little toe out and up. Make sure the band/tubing is positioned to resist the entire motion. °· Hold this position for __________ seconds. °Have  your muscles resist the band/tubing as it slowly pulls your foot back to the starting position.  °Repeat __________ times. Complete this exercise __________ times per day.  °STRENGTH - Ankle Inversion °· Secure one end of a rubber exercise band/tubing to a fixed object (table, pole). Loop the other end around your foot just before your toes. °· Place your fists between your knees. This will focus your strengthening at your ankle. °· Slowly, pull your big toe up and in, making sure the band/tubing is positioned to resist the entire motion. °· Hold this position for __________ seconds. °· Have your muscles resist the band/tubing as it slowly pulls your foot back to the starting position. °Repeat __________ times. Complete this exercises __________ times per day.  °STRENGTH - Towel Curls °· Sit in a chair positioned on a non-carpeted surface. °· Place your right / left foot on a towel, keeping your heel on the floor. °· Pull the towel toward your heel by only curling your toes. Keep your heel on the floor. °· If instructed by your physician, physical therapist,   or athletic trainer, add weight to the end of the towel. Repeat __________ times. Complete this exercise __________ times per day.   This information is not intended to replace advice given to you by your health care provider. Make sure you discuss any questions you have with your health care provider.   Document Released: 05/07/2005 Document Revised: 10/27/2014 Document Reviewed: 01/18/2009 Elsevier Interactive Patient Education 2016 [employer]   Continue ice and ibuprofen for pain and swelling. Use Percocet as needed for severe pain. Follow-up with the orthopedist next week if not improving.

## 2016-01-22 ENCOUNTER — Emergency Department: Payer: Self-pay

## 2016-01-22 ENCOUNTER — Emergency Department
Admission: EM | Admit: 2016-01-22 | Discharge: 2016-01-22 | Disposition: A | Discharge status: Home / Self Care | Payer: Self-pay | Attending: Emergency Medicine | Admitting: Emergency Medicine

## 2016-01-22 ENCOUNTER — Encounter: Payer: Self-pay | Admitting: Emergency Medicine

## 2016-01-22 DIAGNOSIS — R55 Syncope and collapse: Secondary | ICD-10-CM | POA: Insufficient documentation

## 2016-01-22 DIAGNOSIS — G40909 Epilepsy, unspecified, not intractable, without status epilepticus: Secondary | ICD-10-CM | POA: Insufficient documentation

## 2016-01-22 DIAGNOSIS — D649 Anemia, unspecified: Secondary | ICD-10-CM | POA: Insufficient documentation

## 2016-01-22 DIAGNOSIS — Z91013 Allergy to seafood: Secondary | ICD-10-CM | POA: Insufficient documentation

## 2016-01-22 LAB — COMPREHENSIVE METABOLIC PANEL
ALK PHOS: 47 U/L (ref 38–126)
ALT: 11 U/L — ABNORMAL LOW (ref 14–54)
ANION GAP: 3 — AB (ref 5–15)
AST: 16 U/L (ref 15–41)
Albumin: 3.5 g/dL (ref 3.5–5.0)
BUN: 14 mg/dL (ref 6–20)
CALCIUM: 8.5 mg/dL — AB (ref 8.9–10.3)
CHLORIDE: 107 mmol/L (ref 101–111)
CO2: 24 mmol/L (ref 22–32)
Creatinine, Ser: 0.63 mg/dL (ref 0.44–1.00)
Glucose, Bld: 99 mg/dL (ref 65–99)
Potassium: 3.5 mmol/L (ref 3.5–5.1)
SODIUM: 134 mmol/L — AB (ref 135–145)
Total Bilirubin: 0.2 mg/dL — ABNORMAL LOW (ref 0.3–1.2)
Total Protein: 7.4 g/dL (ref 6.5–8.1)

## 2016-01-22 LAB — CBC
HCT: 30.9 % — ABNORMAL LOW (ref 35.0–47.0)
Hemoglobin: 9.8 g/dL — ABNORMAL LOW (ref 12.0–16.0)
MCH: 21.5 pg — ABNORMAL LOW (ref 26.0–34.0)
MCHC: 31.7 g/dL — AB (ref 32.0–36.0)
MCV: 67.6 fL — ABNORMAL LOW (ref 80.0–100.0)
PLATELETS: 452 10*3/uL — AB (ref 150–440)
RBC: 4.56 MIL/uL (ref 3.80–5.20)
RDW: 18.1 % — AB (ref 11.5–14.5)
WBC: 5.2 10*3/uL (ref 3.6–11.0)

## 2016-01-22 LAB — TROPONIN I

## 2016-01-22 LAB — PREGNANCY, URINE: PREG TEST UR: NEGATIVE

## 2016-01-22 MED ORDER — SODIUM CHLORIDE 0.9 % IV SOLN
Freq: Once | INTRAVENOUS | Status: AC
  Administered 2016-01-22: 11:00:00 via INTRAVENOUS

## 2016-01-22 MED ORDER — LEVETIRACETAM 500 MG PO TABS: 500.0000 mg | ORAL_TABLET | Freq: Two times a day (BID) | ORAL | Status: DC

## 2016-01-22 MED ORDER — FERROUS SULFATE DRIED ER 160 (50 FE) MG PO TBCR: 160.0000 mg | EXTENDED_RELEASE_TABLET | Freq: Every day | ORAL | Status: DC

## 2016-01-22 NOTE — Discharge Instructions (Signed)
Anemia, Nonspecific °Anemia is a condition in which the concentration of red blood cells or hemoglobin in the blood is below normal. Hemoglobin is a substance in red blood cells that carries oxygen to the tissues of the body. Anemia results in not enough oxygen reaching these tissues.  °CAUSES  °Common causes of anemia include:  °· Excessive bleeding. Bleeding may be internal or external. This includes excessive bleeding from periods (in women) or from the intestine.   °· Poor nutrition.   °· Chronic kidney, thyroid, and liver disease.  °· Bone marrow disorders that decrease red blood cell production. °· Cancer and treatments for cancer. °· HIV, AIDS, and their treatments. °· Spleen problems that increase red blood cell destruction. °· Blood disorders. °· Excess destruction of red blood cells due to infection, medicines, and autoimmune disorders. °SIGNS AND SYMPTOMS  °· Minor weakness.   °· Dizziness.   °· Headache. °· Palpitations.   °· Shortness of breath, especially with exercise.   °· Paleness. °· Cold sensitivity. °· Indigestion. °· Nausea. °· Difficulty sleeping. °· Difficulty concentrating. °Symptoms may occur suddenly or they may develop slowly.  °DIAGNOSIS  °Additional blood tests are often needed. These help your health care provider determine the best treatment. Your health care provider will check your stool for blood and look for other causes of blood loss.  °TREATMENT  °Treatment varies depending on the cause of the anemia. Treatment can include:  °· Supplements of iron, vitamin B12, or folic acid.   °· Hormone medicines.   °· A blood transfusion. This may be needed if blood loss is severe.   °· Hospitalization. This may be needed if there is significant continual blood loss.   °· Dietary changes. °· Spleen removal. °HOME CARE INSTRUCTIONS °Keep all follow-up appointments. It often takes many weeks to correct anemia, and having your health care provider check on your condition and your response to  treatment is very important. °SEEK IMMEDIATE MEDICAL CARE IF:  °· You develop extreme weakness, shortness of breath, or chest pain.   °· You become dizzy or have trouble concentrating. °· You develop heavy vaginal bleeding.   °· You develop a rash.   °· You have bloody or black, tarry stools.   °· You faint.   °· You vomit up blood.   °· You vomit repeatedly.   °· You have abdominal pain. °· You have a fever or persistent symptoms for more than 2-3 days.   °· You have a fever and your symptoms suddenly get worse.   °· You are dehydrated.   °MAKE SURE YOU: °· Understand these instructions. °· Will watch your condition. °· Will get help right away if you are not doing well or get worse. °  °This information is not intended to replace advice given to you by your health care provider. Make sure you discuss any questions you have with your health care provider. °  °Document Released: 11/13/2004 Document Revised: 06/08/2013 Document Reviewed: 04/01/2013 °Elsevier Interactive Patient Education ©2016 [employer] ° °Syncope °Syncope is a medical term for fainting or passing out. This means you lose consciousness and drop to the ground. People are generally unconscious for less than 5 minutes. You may have some muscle twitches for up to 15 seconds before waking up and returning to normal. Syncope occurs more often in older adults, but it can happen to anyone. While most causes of syncope are not dangerous, syncope can be a sign of a serious medical problem. It is important to seek medical care.  °CAUSES  °Syncope is caused by a sudden drop in blood flow to the brain. The   specific cause is often not determined. Factors that can bring on syncope include: °· Taking medicines that lower blood pressure. °· Sudden changes in posture, such as standing up quickly. °· Taking more medicine than prescribed. °· Standing in one place for too long. °· Seizure disorders. °· Dehydration and excessive exposure to heat. °· Low blood sugar  (hypoglycemia). °· Straining to have a bowel movement. °· Heart disease, irregular heartbeat, or other circulatory problems. °· Fear, emotional distress, seeing blood, or severe pain. °SYMPTOMS  °Right before fainting, you may: °· Feel dizzy or light-headed. °· Feel nauseous. °· See all white or all black in your field of vision. °· Have cold, clammy skin. °DIAGNOSIS  °Your health care provider will ask about your symptoms, perform a physical exam, and perform an electrocardiogram (ECG) to record the electrical activity of your heart. Your health care provider may also perform other heart or blood tests to determine the cause of your syncope which may include: °· Transthoracic echocardiogram (TTE). During echocardiography, sound waves are used to evaluate how blood flows through your heart. °· Transesophageal echocardiogram (TEE). °· Cardiac monitoring. This allows your health care provider to monitor your heart rate and rhythm in real time. °· Holter monitor. This is a portable device that records your heartbeat and can help diagnose heart arrhythmias. It allows your health care provider to track your heart activity for several days, if needed. °· Stress tests by exercise or by giving medicine that makes the heart beat faster. °TREATMENT  °In most cases, no treatment is needed. Depending on the cause of your syncope, your health care provider may recommend changing or stopping some of your medicines. °HOME CARE INSTRUCTIONS °· Have someone stay with you until you feel stable. °· Do not drive, use machinery, or play sports until your health care provider says it is okay. °· Keep all follow-up appointments as directed by your health care provider. °· Lie down right away if you start feeling like you might faint. Breathe deeply and steadily. Wait until all the symptoms have passed. °· Drink enough fluids to keep your urine clear or pale yellow. °· If you are taking blood pressure or heart medicine, get up slowly and  take several minutes to sit and then stand. This can reduce dizziness. °SEEK IMMEDIATE MEDICAL CARE IF:  °· You have a severe headache. °· You have unusual pain in the chest, abdomen, or back. °· You are bleeding from your mouth or rectum, or you have black or tarry stool. °· You have an irregular or very fast heartbeat. °· You have pain with breathing. °· You have repeated fainting or seizure-like jerking during an episode. °· You faint when sitting or lying down. °· You have confusion. °· You have trouble walking. °· You have severe weakness. °· You have vision problems. °If you fainted, call your local emergency services (911 in [place] Do not drive yourself to the hospital.  °  °This information is not intended to replace advice given to you by your health care provider. Make sure you discuss any questions you have with your health care provider. °  °Document Released: 10/06/2005 Document Revised: 02/20/2015 Document Reviewed: 12/05/2011 °Elsevier Interactive Patient Education ©2016 [employer] ° °

## 2016-01-22 NOTE — ED Notes (Signed)
Pt to ed with c/o 2 unwitnessed syncopal episodes this am,  Pt states she was in the bathroom and then awoke on the floor and then had another episode a few minutes later.  Pt reports she is supposed to be on keppra for seizures but does not have the funds to buy it.  Pt reports last dose of keppra was 2 weeks ago.

## 2016-01-22 NOTE — ED Provider Notes (Signed)
[facility] Emergency Department Provider Note     Time seen: ----------------------------------------- 8:34 AM on 01/22/2016 -----------------------------------------    I have reviewed the triage vital signs and the nursing notes.   HISTORY  Chief Complaint Loss of Consciousness    HPI Margaret Clark is a 25 y.o. female who presents to ER for syncopal episodes this morning.Patient states she has a history of seizures and this did not feel like a seizure. She states she was brushing her teeth and then woke up on the floor. She states she has run out of money and can't afford her Keppra but did not have a postictal period like her previous seizures. She denies any recent illness, denies any complaints at this time.   Past Medical History  Diagnosis Date  . Seizures (HCC)   . Epilepsia (HCC)     There are no active problems to display for this patient.   History reviewed. No pertinent past surgical history.  Allergies Shellfish allergy  Social History Social History  Substance Use Topics  . Smoking status: Never Smoker   . Smokeless tobacco: Never Used  . Alcohol Use: No    Review of Systems Constitutional: Negative for fever. Eyes: Negative for visual changes. ENT: Negative for sore throat. Cardiovascular: Negative for chest pain. Respiratory: Negative for shortness of breath. Gastrointestinal: Negative for abdominal pain, vomiting and diarrhea. Genitourinary: Negative for dysuria. Musculoskeletal: Negative for back pain. Skin: Negative for rash. Neurological: Negative for headaches, focal weakness or numbness.  10-point ROS otherwise negative.  ____________________________________________   PHYSICAL EXAM:  VITAL SIGNS: ED Triage Vitals  Enc Vitals Group     BP 01/22/16 0818 108/72 mmHg     Pulse Rate 01/22/16 0818 89     Resp 01/22/16 0818 20     Temp 01/22/16 0818 98.1 F (36.7 C)     Temp Source 01/22/16 0818  Oral     SpO2 01/22/16 0818 100 %     Weight 01/22/16 0818 245 lb (111.131 kg)     Height 01/22/16 0818 5\' 5"  (1.651 m)     Head Cir --      Peak Flow --      Pain Score 01/22/16 0819 8     Pain Loc --      Pain Edu? --      Excl. in GC? --     Constitutional: Alert and oriented. Well appearing and in no distress. Eyes: Conjunctivae are normal. PERRL. Normal extraocular movements. ENT   Head: Normocephalic and atraumatic.   Nose: No congestion/rhinnorhea.   Mouth/Throat: Mucous membranes are moist.   Neck: No stridor. Cardiovascular: Normal rate, regular rhythm. Normal and symmetric distal pulses are present in all extremities. No murmurs, rubs, or gallops. Respiratory: Normal respiratory effort without tachypnea nor retractions. Breath sounds are clear and equal bilaterally. No wheezes/rales/rhonchi. Gastrointestinal: Soft and nontender. No distention. No abdominal bruits.  Musculoskeletal: Nontender with normal range of motion in all extremities. No joint effusions.  No lower extremity tenderness nor edema. Neurologic:  Normal speech and language. No gross focal neurologic deficits are appreciated.  Skin:  Skin is warm, dry and intact. No rash noted. Psychiatric: Mood and affect are normal. Speech and behavior are normal. Patient exhibits appropriate insight and judgment. ____________________________________________  EKG: Interpreted by me.Sinus rhythm with a rate of 73 bpm, normal PR interval, normal QRS, normal QT interval. Normal axis. Grossly unremarkable EKG  ____________________________________________  ED COURSE:  Pertinent labs & imaging results that  were available during my care of the patient were reviewed by me and considered in my medical decision making (see chart for details). Patient with syncope of unclear etiology. We'll check basic labs and reevaluate. ____________________________________________    LABS (pertinent positives/negatives)  Labs  Reviewed  CBC - Abnormal; Notable for the following:    Hemoglobin 9.8 (*)    HCT 30.9 (*)    MCV 67.6 (*)    MCH 21.5 (*)    MCHC 31.7 (*)    RDW 18.1 (*)    Platelets 452 (*)    All other components within normal limits  COMPREHENSIVE METABOLIC PANEL - Abnormal; Notable for the following:    Sodium 134 (*)    Calcium 8.5 (*)    ALT 11 (*)    Total Bilirubin 0.2 (*)    Anion gap 3 (*)    All other components within normal limits  TROPONIN I  PREGNANCY, URINE    RADIOLOGY Images were viewed by me  Chest x-ray  IMPRESSION: No acute cardiopulmonary abnormality.  ____________________________________________  FINAL ASSESSMENT AND PLAN  Syncope, Anemia  Plan: Patient with labs and imaging as dictated above. Patient with unexplained syncope. Currently she is feeling better, has received IV fluids. She does appear to be anemic with likely iron deficiency as the cause. She'll be placed on iron and referred to cardiology for follow-up.   [name], MD   [name], MD 01/22/16 [phone]

## 2016-01-22 NOTE — ED Notes (Signed)
Orthostatic Vital Signs:  Laying:  105/47; 64  Sitting:  102/63; 73  Standing:  97/64; 84

## 2016-02-07 ENCOUNTER — Encounter: Payer: Self-pay | Admitting: Emergency Medicine

## 2016-02-07 ENCOUNTER — Emergency Department
Admission: EM | Admit: 2016-02-07 | Discharge: 2016-02-07 | Disposition: A | Discharge status: Home / Self Care | Payer: Self-pay | Attending: Emergency Medicine | Admitting: Emergency Medicine

## 2016-02-07 DIAGNOSIS — Y929 Unspecified place or not applicable: Secondary | ICD-10-CM | POA: Insufficient documentation

## 2016-02-07 DIAGNOSIS — S39012A Strain of muscle, fascia and tendon of lower back, initial encounter: Secondary | ICD-10-CM | POA: Insufficient documentation

## 2016-02-07 DIAGNOSIS — X58XXXA Exposure to other specified factors, initial encounter: Secondary | ICD-10-CM | POA: Insufficient documentation

## 2016-02-07 DIAGNOSIS — T148XXA Other injury of unspecified body region, initial encounter: Secondary | ICD-10-CM

## 2016-02-07 DIAGNOSIS — Z8673 Personal history of transient ischemic attack (TIA), and cerebral infarction without residual deficits: Secondary | ICD-10-CM | POA: Insufficient documentation

## 2016-02-07 DIAGNOSIS — Y999 Unspecified external cause status: Secondary | ICD-10-CM | POA: Insufficient documentation

## 2016-02-07 DIAGNOSIS — F1299 Cannabis use, unspecified with unspecified cannabis-induced disorder: Secondary | ICD-10-CM | POA: Insufficient documentation

## 2016-02-07 DIAGNOSIS — Y939 Activity, unspecified: Secondary | ICD-10-CM | POA: Insufficient documentation

## 2016-02-07 MED ORDER — DIAZEPAM 2 MG PO TABS: 2.0000 mg | ORAL_TABLET | Freq: Three times a day (TID) | ORAL | Status: DC | PRN

## 2016-02-07 NOTE — ED Provider Notes (Signed)
CSN: [id number]     Arrival date & time 02/07/16  1304 History   First MD Initiated Contact with Patient 02/07/16 1338     Chief Complaint  Patient presents with  . Back Pain     HPI   25 year old female who presents to the emergency department for evaluation of right back pain. She states that she had a seizure yesterday and feels that she may have injured her back. The seizure was unwitnessed. The pain is in the right side and is worse with turning or moving. He is not taking anything to help with the pain. She states that she had a seizure while laying on her bed and does not feel that she struck anything during the episode. She is unsure how long the seizure lasted. She is not taking her Keppra as prescribed, because she can't afford it. She is only taking it twice a day instead of 4 times.  Past Medical History  Diagnosis Date  . Seizures (HCC)   . Epilepsia [facility])    History reviewed. No pertinent past surgical history. No family history on file. Social History  Substance Use Topics  . Smoking status: Never Smoker   . Smokeless tobacco: Never Used  . Alcohol Use: No   OB History    No data available     Review of Systems  Constitutional: Negative for activity change.  Respiratory: Negative.   Musculoskeletal: Positive for myalgias. Negative for neck pain and neck stiffness.  Skin: Negative.   Neurological: Negative for weakness, numbness and headaches.  Psychiatric/Behavioral: Negative for confusion.      Allergies  Shellfish allergy  Home Medications   Prior to Admission medications   Medication Sig Start Date End Date Taking? Authorizing Provider  diazepam (VALIUM) 2 MG tablet Take 1 tablet (2 mg total) by mouth every 8 (eight) hours as needed. 02/07/16   Chinita Pester, FNP  ferrous sulfate (EQL SLOW RELEASE IRON) 160 (50 Fe) MG TBCR SR tablet Take 1 tablet (160 mg total) by mouth daily. 01/22/16   [name], MD  ibuprofen (ADVIL,MOTRIN) 800 MG tablet  Take 1 tablet (800 mg total) by mouth every 8 (eight) hours as needed. 12/14/15   Ignacia Bayley, PA-C  levETIRAcetam (KEPPRA) 500 MG tablet Take 1 tablet (500 mg total) by mouth 2 (two) times daily. 01/22/16   [name], MD   BP 122/88 mmHg  Pulse 80  Temp(Src) 98.6 F (37 C) (Oral)  Resp 16  Ht  (1.626 m)  Wt 114.873 kg  BMI 43.45 kg/m2  SpO2 100%  LMP 01/15/2016 Physical Exam  Constitutional: She is oriented to person, place, and time. She appears well-developed and well-nourished.  Eyes: EOM are normal.  Neck: Normal range of motion.  Pulmonary/Chest: Effort normal and breath sounds normal.  Musculoskeletal: Normal range of motion.       Arms: Neurological: She is alert and oriented to person, place, and time.  Skin: Skin is warm.  Nursing note and vitals reviewed.   ED Course  Procedures (including critical care time) Labs Review Labs Reviewed - No data to display  Imaging Review No results found. I have personally reviewed and evaluated these images and lab results as part of my medical decision-making.   EKG Interpretation None      MDM   Final diagnoses:  Musculoskeletal strain    Patient was given a prescription for Valium 2 mg tablets to be taken every 8 hours as  needed. She was also given a packet of information for medication management. She states she is unable to afford her Keppra. She was also advised to continue taking the ibuprofen if needed for pain in addition to the Valium 2 mg.    Chinita PesterCari B Zanaiya Calabria, FNP 02/07/16 1506  Sharyn CreamerMark Quale, MD 02/07/16 1610

## 2016-02-07 NOTE — ED Notes (Signed)
Pt presents to ED with mid back pain on right side.

## 2016-02-08 ENCOUNTER — Emergency Department
Admission: EM | Admit: 2016-02-08 | Discharge: 2016-02-08 | Disposition: A | Discharge status: Home / Self Care | Payer: Self-pay | Attending: Emergency Medicine | Admitting: Emergency Medicine

## 2016-02-08 DIAGNOSIS — Z79899 Other long term (current) drug therapy: Secondary | ICD-10-CM | POA: Insufficient documentation

## 2016-02-08 DIAGNOSIS — F1290 Cannabis use, unspecified, uncomplicated: Secondary | ICD-10-CM | POA: Insufficient documentation

## 2016-02-08 DIAGNOSIS — G40909 Epilepsy, unspecified, not intractable, without status epilepticus: Secondary | ICD-10-CM | POA: Insufficient documentation

## 2016-02-08 LAB — POCT PREGNANCY, URINE: PREG TEST UR: NEGATIVE

## 2016-02-08 MED ORDER — SODIUM CHLORIDE 0.9 % IV SOLN
1000.0000 mg | Freq: Once | INTRAVENOUS | Status: DC
  Filled 2016-02-08: qty 10

## 2016-02-08 MED ORDER — LEVETIRACETAM 500 MG PO TABS
1000.0000 mg | ORAL_TABLET | Freq: Once | ORAL | Status: AC
  Administered 2016-02-08: 1000 mg via ORAL
  Filled 2016-02-08: qty 2

## 2016-02-08 NOTE — ED Notes (Signed)
Pt came via EMS from work c/o seizures. Pt has history of seizures, takes keppra. Pt reports she has been compliant with her medications and her meds were changed 1 month ago. Pt reports "feeling funny."

## 2016-02-08 NOTE — Discharge Instructions (Signed)

## 2016-02-08 NOTE — ED Provider Notes (Signed)
Time Seen: Approximately 1900 I have reviewed the triage notes  Chief Complaint: Seizures   History of Present Illness: Margaret Clark is a 25 y.o. female who presents with acute exacerbation of a history of seizures. Patient states that she has been noncompliant with her medication. She states she does have prescriptions, etc. for Her at home. She states that she's been trying to "" stretch it out "". Patient had a seizure while at work today seemed to be typical of her previous seizures and was brief in nature and generalized. The patient denies any headaches. She did not bite her tongue, urinate or defecate. She denies any shoulder discomfort or recent head trauma. She states she was at work today and her coworkers "" panicked "" and called the ambulance. Otherwise she feels fine and does not want to have any laboratory work or any further studies done at this time.   Past Medical History  Diagnosis Date  . Seizures (HCC)   . Epilepsia (HCC)     There are no active problems to display for this patient.   History reviewed. No pertinent past surgical history.  History reviewed. No pertinent past surgical history.  Current Outpatient Rx  Name  Route  Sig  Dispense  Refill  . diazepam (VALIUM) 2 MG tablet   Oral   Take 1 tablet (2 mg total) by mouth every 8 (eight) hours as needed.   12 tablet   0   . ferrous sulfate (EQL SLOW RELEASE IRON) 160 (50 Fe) MG TBCR SR tablet   Oral   Take 1 tablet (160 mg total) by mouth daily.   30 each   6   . ibuprofen (ADVIL,MOTRIN) 800 MG tablet   Oral   Take 1 tablet (800 mg total) by mouth every 8 (eight) hours as needed.   15 tablet   0   . levETIRAcetam (KEPPRA) 500 MG tablet   Oral   Take 1 tablet (500 mg total) by mouth 2 (two) times daily.   60 tablet   1     Allergies:  Shellfish allergy  Family History: No family history on file.  Social History: Social History  Substance Use Topics  . Smoking status: Never  Smoker   . Smokeless tobacco: Never Used  . Alcohol Use: No     Review of Systems:   10 point review of systems was performed and was otherwise negative:  Constitutional: No fever Eyes: No visual disturbances ENT: No sore throat, ear pain Cardiac: No chest pain Respiratory: No shortness of breath, wheezing, or stridor Abdomen: No abdominal pain, no vomiting, No diarrhea Endocrine: No weight loss, No night sweats Extremities: No peripheral edema, cyanosis Skin: No rashes, easy bruising Neurologic: No focal weakness, trouble with speech or swollowing Urologic: No dysuria, Hematuria, or urinary frequency   Physical Exam:  ED Triage Vitals  Enc Vitals Group     BP 02/08/16 1731 106/60 mmHg     Pulse Rate 02/08/16 1731 78     Resp 02/08/16 1731 12     Temp 02/08/16 1731 98.2 F (36.8 C)     Temp Source 02/08/16 1731 Oral     SpO2 02/08/16 1731 98 %     Weight 02/08/16 1731 250 lb (113.399 kg)     Height 02/08/16 1731 5\' 4"  (1.626 m)     Head Cir --      Peak Flow --      Pain Score --  Pain Loc --      Pain Edu? --      Excl. in GC? --     General: Awake , Alert , and Oriented times 3; GCS 15 Head: Normal cephalic , atraumatic Eyes: Pupils equal , round, reactive to light Nose/Throat: No nasal drainage, patent upper airway without erythema or exudate.  Neck: Supple, Full range of motion, No anterior adenopathy or palpable thyroid masses Lungs: Clear to ascultation without wheezes , rhonchi, or rales Heart: Regular rate, regular rhythm without murmurs , gallops , or rubs Abdomen: Soft, non tender without rebound, guarding , or rigidity; bowel sounds positive and symmetric in all 4 quadrants. No organomegaly .        Extremities: 2 plus symmetric pulses. No edema, clubbing or cyanosis Neurologic: normal ambulation, Motor symmetric without deficits, sensory intact Skin: warm, dry, no rashes   Labs:   All laboratory work was reviewed including any pertinent  negatives or positives listed below:  Labs Reviewed  POC URINE PREG, ED  POCT PREGNANCY, URINE  Pregnancy test was negative and review of her recent labs earlier this month showed no acute abnormalities  ED Course:  Patient again requested that we not perform any further laboratory work and she states she has plenty of prescription and has access to her medication nose the dosage etc.   Assessment:  Acute exacerbation of chronic seizure disorder Medical noncompliance  Final Clinical Impression:   Final diagnoses:  Seizure disorder [facility])     Plan:  Outpatient management Patient was advised to return immediately if condition worsens. Patient was advised to follow up with their primary care physician or other specialized physicians involved in their outpatient care. The patient and/or family member/power of attorney had laboratory results reviewed at the bedside. All questions and concerns were addressed and appropriate discharge instructions were distributed by the nursing staff.            Jennye Moccasin, MD 02/08/16 [phone]

## 2016-02-12 ENCOUNTER — Encounter: Payer: Self-pay | Admitting: Emergency Medicine

## 2016-02-12 ENCOUNTER — Emergency Department
Admission: EM | Admit: 2016-02-12 | Discharge: 2016-02-12 | Disposition: A | Discharge status: Home / Self Care | Payer: Self-pay | Attending: Emergency Medicine | Admitting: Emergency Medicine

## 2016-02-12 DIAGNOSIS — Z79899 Other long term (current) drug therapy: Secondary | ICD-10-CM | POA: Insufficient documentation

## 2016-02-12 DIAGNOSIS — M791 Myalgia, unspecified site: Secondary | ICD-10-CM

## 2016-02-12 DIAGNOSIS — F1290 Cannabis use, unspecified, uncomplicated: Secondary | ICD-10-CM | POA: Insufficient documentation

## 2016-02-12 DIAGNOSIS — Z8669 Personal history of other diseases of the nervous system and sense organs: Secondary | ICD-10-CM | POA: Insufficient documentation

## 2016-02-12 NOTE — ED Notes (Signed)
Patient ambulatory to triage with steady gait, without difficulty or distress noted; pt reports sched for EEG at 830am; here due to left hand pain; st was seen here Friday for seizure and unsure of known injury but has had pain to hand/wrist since

## 2016-02-12 NOTE — ED Notes (Signed)
Pt here for left arm pain.  Pt reports having seizure on Friday. This weekend starting having left arm pain.  Took hydrocodone at home without relief.  Pt reports that she has not been taking her Keppra as prescribed as she is trying to extend the medication due to "insurance problems"  Pt is alert and oriented. Denies seizure today. Pain is described as an "ache". Able to fully move left arm.

## 2016-02-12 NOTE — Discharge Instructions (Signed)

## 2016-02-12 NOTE — ED Provider Notes (Signed)
[facility] Emergency Department Provider Note  ____________________________________________  Time seen: On arrival  I have reviewed the triage vital signs and the nursing notes.   HISTORY  Chief Complaint Hand Pain    HPI Margaret Clark is a 25 y.o. female who presents with complaints of left arm "aching" which is mild. Patient reports she had a seizure on Friday and the discomfort started later that evening. She has normal strength and sensation in her arm. She complains of it being sore like she worked out. She does not remember an injury to the arm. No bruising or abrasions. She has an EEG scheduled today at 8:30    Past Medical History  Diagnosis Date  . Seizures (HCC)   . Epilepsia (HCC)     There are no active problems to display for this patient.   History reviewed. No pertinent past surgical history.  Current Outpatient Rx  Name  Route  Sig  Dispense  Refill  . diazepam (VALIUM) 2 MG tablet   Oral   Take 1 tablet (2 mg total) by mouth every 8 (eight) hours as needed.   12 tablet   0   . ferrous sulfate (EQL SLOW RELEASE IRON) 160 (50 Fe) MG TBCR SR tablet   Oral   Take 1 tablet (160 mg total) by mouth daily.   30 each   6   . ibuprofen (ADVIL,MOTRIN) 800 MG tablet   Oral   Take 1 tablet (800 mg total) by mouth every 8 (eight) hours as needed.   15 tablet   0   . levETIRAcetam (KEPPRA) 500 MG tablet   Oral   Take 1 tablet (500 mg total) by mouth 2 (two) times daily.   60 tablet   1     Allergies Shellfish allergy  No family history on file.  Social History Social History  Substance Use Topics  . Smoking status: Never Smoker   . Smokeless tobacco: Never Used  . Alcohol Use: No    Review of Systems  Constitutional: Negative for fever. Eyes: Negative for visual changes. ENT: Negative for Neck pain    Musculoskeletal: Muscle soreness as above Skin: Negative for abrasion Neurological: Negative for  headaches or focal weakness, no sensory deficits   ____________________________________________   PHYSICAL EXAM:  VITAL SIGNS: ED Triage Vitals  Enc Vitals Group     BP 02/12/16 0631 110/58 mmHg     Pulse Rate 02/12/16 0631 88     Resp 02/12/16 0631 20     Temp 02/12/16 0631 98 F (36.7 C)     Temp Source 02/12/16 0631 Oral     SpO2 02/12/16 0631 99 %     Weight 02/12/16 0631 250 lb (113.399 kg)     Height 02/12/16 0631  (1.626 m)     Head Cir --      Peak Flow --      Pain Score 02/12/16 0632 8     Pain Loc --      Pain Edu? --      Excl. in GC? --      Constitutional: Alert and oriented. Well appearing and in no distress. Eyes: Conjunctivae are normal.  ENT   Head: Normocephalic and atraumatic.   Mouth/Throat: Mucous membranes are moist. Cardiovascular: Normal rate, regular rhythm. 2+ pulses in the upper extremity, normal cap Refill Respiratory: Normal respiratory effort without tachypnea nor retractions.   Musculoskeletal: Left upper extremity: Warm and well perfused, normal range of  motion without pain. Very mild muscle tenderness diffusely, primarily along the extensor muscles. No discoloration or erythema. Normal tendon function. Normal strength. Neurologic:  Normal speech and language. Normal strength and sensation in the upper extremity. Skin:  Skin is warm, dry and intact. No rash noted. Psychiatric: Mood and affect are normal. Patient exhibits appropriate insight and judgment.  ____________________________________________    LABS (pertinent positives/negatives)  Labs Reviewed - No data to display  ____________________________________________     ____________________________________________    RADIOLOGY I have personally reviewed any xrays that were ordered on this patient: None  ____________________________________________   PROCEDURES  Procedure(s) performed: none   ____________________________________________   INITIAL  IMPRESSION / ASSESSMENT AND PLAN / ED COURSE  Pertinent labs & imaging results that were available during my care of the patient were reviewed by me and considered in my medical decision making (see chart for details).  Patient with very mild symptoms in the left arm which I suspect are related to muscle spasming during her seizure now followed by muscle soreness. Neurologically intact. No evidence of arterial insufficiency. Patient has EEG today and neurology follow-up.  ____________________________________________   FINAL CLINICAL IMPRESSION(S) / ED DIAGNOSES  Final diagnoses:  Muscle soreness     Margaret Everyobert Naziah Weckerly, MD 02/12/16 [phone]

## 2016-02-14 ENCOUNTER — Emergency Department
Admission: EM | Admit: 2016-02-14 | Discharge: 2016-02-14 | Disposition: A | Discharge status: Home / Self Care | Payer: Self-pay | Attending: Emergency Medicine | Admitting: Emergency Medicine

## 2016-02-14 ENCOUNTER — Emergency Department: Payer: Self-pay

## 2016-02-14 ENCOUNTER — Encounter: Payer: Self-pay | Admitting: *Deleted

## 2016-02-14 DIAGNOSIS — W06XXXA Fall from bed, initial encounter: Secondary | ICD-10-CM | POA: Insufficient documentation

## 2016-02-14 DIAGNOSIS — Z8669 Personal history of other diseases of the nervous system and sense organs: Secondary | ICD-10-CM | POA: Insufficient documentation

## 2016-02-14 DIAGNOSIS — S40012A Contusion of left shoulder, initial encounter: Secondary | ICD-10-CM | POA: Insufficient documentation

## 2016-02-14 DIAGNOSIS — F1290 Cannabis use, unspecified, uncomplicated: Secondary | ICD-10-CM | POA: Insufficient documentation

## 2016-02-14 DIAGNOSIS — Y939 Activity, unspecified: Secondary | ICD-10-CM | POA: Insufficient documentation

## 2016-02-14 DIAGNOSIS — Y999 Unspecified external cause status: Secondary | ICD-10-CM | POA: Insufficient documentation

## 2016-02-14 DIAGNOSIS — Y929 Unspecified place or not applicable: Secondary | ICD-10-CM | POA: Insufficient documentation

## 2016-02-14 MED ORDER — MELOXICAM 15 MG PO TABS: 15.0000 mg | ORAL_TABLET | Freq: Every day | ORAL | Status: DC

## 2016-02-14 MED ORDER — METHOCARBAMOL 500 MG PO TABS
500.0000 mg | ORAL_TABLET | Freq: Once | ORAL | Status: AC
  Administered 2016-02-14: 500 mg via ORAL
  Filled 2016-02-14: qty 1

## 2016-02-14 MED ORDER — DIAZEPAM 2 MG PO TABS: 2.0000 mg | ORAL_TABLET | Freq: Three times a day (TID) | ORAL | Status: DC | PRN

## 2016-02-14 MED ORDER — IBUPROFEN 800 MG PO TABS
800.0000 mg | ORAL_TABLET | Freq: Once | ORAL | Status: AC
  Administered 2016-02-14: 800 mg via ORAL
  Filled 2016-02-14: qty 1

## 2016-02-14 NOTE — ED Notes (Signed)
See triage note  States she fell onto left shoulder post sz this am

## 2016-02-14 NOTE — ED Provider Notes (Signed)
[facility] Emergency Department Provider Note  ____________________________________________  Time seen: Approximately 3:23 PM  I have reviewed the triage vital signs and the nursing notes.   HISTORY  Chief Complaint Shoulder Pain    HPI Margaret Clark is a 25 y.o. female who presents for evaluation of left shoulder pain. Patient reports that she fell out of bed this morning after having a seizure. 99 and table. Complains of increased pain with extension or abduction. Describes pain as 9 out of 10 nonradiating. Denies any numbness or tingling no loss of consciousness.   Past Medical History  Diagnosis Date  . Seizures (HCC)   . Epilepsia (HCC)     There are no active problems to display for this patient.   History reviewed. No pertinent past surgical history.  Current Outpatient Rx  Name  Route  Sig  Dispense  Refill  . diazepam (VALIUM) 2 MG tablet   Oral   Take 1 tablet (2 mg total) by mouth every 8 (eight) hours as needed for anxiety.   12 tablet   0   . ferrous sulfate (EQL SLOW RELEASE IRON) 160 (50 Fe) MG TBCR SR tablet   Oral   Take 1 tablet (160 mg total) by mouth daily.   30 each   6   . levETIRAcetam (KEPPRA) 500 MG tablet   Oral   Take 1 tablet (500 mg total) by mouth 2 (two) times daily.   60 tablet   1   . meloxicam (MOBIC) 15 MG tablet   Oral   Take 1 tablet (15 mg total) by mouth daily.   30 tablet   0     Allergies Shellfish allergy  History reviewed. No pertinent family history.  Social History Social History  Substance Use Topics  . Smoking status: Never Smoker   . Smokeless tobacco: Never Used  . Alcohol Use: No    Review of Systems Constitutional: No fever/chills Musculoskeletal: Positive for left shoulder pain. Skin: Negative for rash. Neurological: Negative for headaches, focal weakness or numbness.  10-point ROS otherwise  negative.  ____________________________________________   PHYSICAL EXAM:  VITAL SIGNS: ED Triage Vitals  Enc Vitals Group     BP 02/14/16 1445 139/91 mmHg     Pulse Rate 02/14/16 1445 87     Resp 02/14/16 1445 18     Temp 02/14/16 1445 98.1 F (36.7 C)     Temp Source 02/14/16 1445 Oral     SpO2 02/14/16 1445 100 %     Weight 02/14/16 1445 256 lb (116.121 kg)     Height 02/14/16 1445 5\' 4"  (1.626 m)     Head Cir --      Peak Flow --      Pain Score 02/14/16 1446 9     Pain Loc --      Pain Edu? --      Excl. in GC? --     Constitutional: Alert and oriented. Well appearing and in no acute distress. Cardiovascular: Normal rate, regular rhythm. Grossly normal heart sounds.  Good peripheral circulation. Respiratory: Normal respiratory effort.  No retractions. Lungs CTAB. Musculoskeletal: Left shoulder with point tenderness noticed to the posterior aspect of the glenohumeral joint. No ecchymosis or bruising noted. Increased pain with abduction and extension. Distally neurovascularly intact. Neurologic:  Normal speech and language. No gross focal neurologic deficits are appreciated. No gait instability. Skin:  Skin is warm, dry and intact. No rash noted. Psychiatric: Mood and affect are normal.  Speech and behavior are normal.  ____________________________________________   LABS (all labs ordered are listed, but only abnormal results are displayed)  Labs Reviewed - No data to display    PROCEDURES  Procedure(s) performed: None  Critical Care performed: No  ____________________________________________   INITIAL IMPRESSION / ASSESSMENT AND PLAN / ED COURSE  Pertinent labs & imaging results that were available during my care of the patient were reviewed by me and considered in my medical decision making (see chart for details).  Status post fall secondary to seizure with left shoulder contusion. Patient provided a sling and given a prescription for Valium and Mobic.   Patient to follow-up with PCP or return to ER with any worsening symptomology. ____________________________________________   FINAL CLINICAL IMPRESSION(S) / ED DIAGNOSES  Final diagnoses:  Shoulder contusion, left, initial encounter     This chart was dictated using voice recognition software/Dragon. Despite best efforts to proofread, errors can occur which can change the meaning. Any change was purely unintentional.   [name], PA-C 02/14/16 1710  Sharyn Creamer, MD 02/16/16 [phone]

## 2016-02-14 NOTE — ED Notes (Signed)
States she had a seizure this AM and fell and now her left shoulder hurts

## 2016-02-25 ENCOUNTER — Emergency Department
Admission: EM | Admit: 2016-02-25 | Discharge: 2016-02-25 | Disposition: A | Discharge status: Home / Self Care | Payer: Self-pay | Attending: Emergency Medicine | Admitting: Emergency Medicine

## 2016-02-25 ENCOUNTER — Encounter: Payer: Self-pay | Admitting: Emergency Medicine

## 2016-02-25 DIAGNOSIS — Y999 Unspecified external cause status: Secondary | ICD-10-CM | POA: Insufficient documentation

## 2016-02-25 DIAGNOSIS — S93401D Sprain of unspecified ligament of right ankle, subsequent encounter: Secondary | ICD-10-CM | POA: Insufficient documentation

## 2016-02-25 DIAGNOSIS — Z8669 Personal history of other diseases of the nervous system and sense organs: Secondary | ICD-10-CM | POA: Insufficient documentation

## 2016-02-25 DIAGNOSIS — F1290 Cannabis use, unspecified, uncomplicated: Secondary | ICD-10-CM | POA: Insufficient documentation

## 2016-02-25 DIAGNOSIS — Z791 Long term (current) use of non-steroidal anti-inflammatories (NSAID): Secondary | ICD-10-CM | POA: Insufficient documentation

## 2016-02-25 DIAGNOSIS — Y929 Unspecified place or not applicable: Secondary | ICD-10-CM | POA: Insufficient documentation

## 2016-02-25 DIAGNOSIS — X58XXXD Exposure to other specified factors, subsequent encounter: Secondary | ICD-10-CM | POA: Insufficient documentation

## 2016-02-25 DIAGNOSIS — Y939 Activity, unspecified: Secondary | ICD-10-CM | POA: Insufficient documentation

## 2016-02-25 NOTE — ED Notes (Signed)
R ankle pain since twisted it in February. States has twisted it a couple of times since original injury.

## 2016-02-25 NOTE — ED Provider Notes (Signed)
[facility] Emergency Department Provider Note ____________________________________________  Time seen: 1630  I have reviewed the triage vital signs and the nursing notes.  HISTORY  Chief Complaint  Ankle Pain  HPI Margaret Clark is a 25 y.o. female presents to the ED for evaluation of right ankle pain following several sprains over the last 2 months. She was evaluated her in February after a seizure caused her to fall out of bed, injuring her ankle. She was discharged with negative x-rays and a diagnosis of ankle sprain, given post-op shoe and crutches. She describes recurrent sprains in the interim, related to seizure activity. She denies any recent seizures or ankle injury. She is also requesting a full-duty work note release. Pain is 6/10 in triage.   Past Medical History  Diagnosis Date  . Seizures (HCC)   . Epilepsia (HCC)     There are no active problems to display for this patient.   No past surgical history on file.  Current Outpatient Rx  Name  Route  Sig  Dispense  Refill  . diazepam (VALIUM) 2 MG tablet   Oral   Take 1 tablet (2 mg total) by mouth every 8 (eight) hours as needed for anxiety.   12 tablet   0   . ferrous sulfate (EQL SLOW RELEASE IRON) 160 (50 Fe) MG TBCR SR tablet   Oral   Take 1 tablet (160 mg total) by mouth daily.   30 each   6   . levETIRAcetam (KEPPRA) 500 MG tablet   Oral   Take 1 tablet (500 mg total) by mouth 2 (two) times daily.   60 tablet   1   . meloxicam (MOBIC) 15 MG tablet   Oral   Take 1 tablet (15 mg total) by mouth daily.   30 tablet   0    Allergies Shellfish allergy  No family history on file.  Social History Social History  Substance Use Topics  . Smoking status: Never Smoker   . Smokeless tobacco: Never Used  . Alcohol Use: No   Review of Systems  Constitutional: Negative for fever. Musculoskeletal: Negative for back pain. Right ankle pain as above. Skin: Negative for  rash. Neurological: Negative for headaches, focal weakness or numbness. ____________________________________________  PHYSICAL EXAM:  VITAL SIGNS: ED Triage Vitals  Enc Vitals Group     BP 02/25/16 1552 153/80 mmHg     Pulse Rate 02/25/16 1552 102     Resp 02/25/16 1552 20     Temp 02/25/16 1552 98.7 F (37.1 C)     Temp Source 02/25/16 1552 Oral     SpO2 02/25/16 1552 99 %     Weight 02/25/16 1552 250 lb (113.399 kg)     Height 02/25/16 1552 5\' 4"  (1.626 m)     Head Cir --      Peak Flow --      Pain Score 02/25/16 1553 8     Pain Loc --      Pain Edu? --      Excl. in GC? --    Constitutional: Alert and oriented. Well appearing and in no distress. Head: Normocephalic and atraumatic. Cardiovascular: Normal distal pulses and cap refill. Respiratory: Normal respiratory effort.  Musculoskeletal: right ankle without deformity, swelling, effusion, or dislocation. Normal ankle ROM and strength  Testing. Negative anterior drawer. No calf or achilles tenderness Nontender with normal range of motion in all other extremities.  Neurologic:  Mildly antalgic gait without ataxia. Normal  speech and language. No gross focal neurologic deficits are appreciated. Skin:  Skin is warm, dry and intact. No rash noted. ____________________________________________   RADIOLOGY  Not indicated ____________________________________________  PROCEDURES  Ankle stirrup splint ____________________________________________  INITIAL IMPRESSION / ASSESSMENT AND PLAN / ED COURSE  Right ankle sprain without evidence of internal derangement or fracture. Patient fitted with stirrup splint and exercise instructions. Referral to Dr. Martha Clan, as needed. Work note for full duty tomorrow, provide.  ____________________________________________  FINAL CLINICAL IMPRESSION(S) / ED DIAGNOSES  Final diagnoses:  Ankle sprain, right, subsequent encounter     Lissa Hoard, PA-C 02/25/16  1747  Sharman Cheek, MD 02/26/16 0010

## 2016-02-25 NOTE — Discharge Instructions (Signed)
Wear the ankle splint as directed. Rest with the foot elevated as needed. Apply ice for any swelling. Do the attached ankle exercises as discussed. Follow-up with ortho for any ongoing problems.

## 2016-03-01 ENCOUNTER — Encounter: Payer: Self-pay | Admitting: Emergency Medicine

## 2016-03-01 ENCOUNTER — Emergency Department
Admission: EM | Admit: 2016-03-01 | Discharge: 2016-03-01 | Disposition: A | Discharge status: Home / Self Care | Payer: Self-pay | Attending: Emergency Medicine | Admitting: Emergency Medicine

## 2016-03-01 DIAGNOSIS — G44029 Chronic cluster headache, not intractable: Secondary | ICD-10-CM | POA: Insufficient documentation

## 2016-03-01 DIAGNOSIS — D649 Anemia, unspecified: Secondary | ICD-10-CM | POA: Insufficient documentation

## 2016-03-01 DIAGNOSIS — F1290 Cannabis use, unspecified, uncomplicated: Secondary | ICD-10-CM | POA: Insufficient documentation

## 2016-03-01 DIAGNOSIS — G40909 Epilepsy, unspecified, not intractable, without status epilepticus: Secondary | ICD-10-CM | POA: Insufficient documentation

## 2016-03-01 DIAGNOSIS — Z79899 Other long term (current) drug therapy: Secondary | ICD-10-CM | POA: Insufficient documentation

## 2016-03-01 DIAGNOSIS — R200 Anesthesia of skin: Secondary | ICD-10-CM | POA: Insufficient documentation

## 2016-03-01 DIAGNOSIS — R202 Paresthesia of skin: Secondary | ICD-10-CM

## 2016-03-01 DIAGNOSIS — Z91013 Allergy to seafood: Secondary | ICD-10-CM | POA: Insufficient documentation

## 2016-03-01 MED ORDER — FERROUS SULFATE 325 (65 FE) MG PO TABS: 325.0000 mg | ORAL_TABLET | Freq: Every day | ORAL | Status: DC

## 2016-03-01 NOTE — ED Provider Notes (Signed)
CSN: [id number]     Arrival date & time 03/01/16  7829 History   First MD Initiated Contact with Patient 03/01/16 1008     Chief Complaint  Patient presents with  . Headache     HPI Comments: 25 year old female with a history of epilepsy presents today complaining of right sided headache that has been a chronic issue. About two weeks ago she started to have some "tingling" on the right side of her head. Reports it feels like her forehead is falling asleep. She has not had any other associated symptoms. These episodes occur 3-4 times per day and last 3 minutes at a time. No photophobia, phonophobia, nausea or vomiting associated with the headaches. She is concerned this could be related to her seizures. Has not had any seizure activity in over a month. Is on Keppra daily but is not monitored by a neurologist because she is uninsured. Reports as long as she is compliant with medication she tends to be seizure free.   The history is provided by the patient.    Past Medical History  Diagnosis Date  . Seizures (HCC)   . Epilepsia [facility])    History reviewed. No pertinent past surgical history. No family history on file. Social History  Substance Use Topics  . Smoking status: Never Smoker   . Smokeless tobacco: Never Used  . Alcohol Use: No   OB History    No data available     Review of Systems  Eyes: Negative for photophobia, pain and visual disturbance.  Neurological: Positive for facial asymmetry, numbness and headaches. Negative for dizziness, seizures, syncope, speech difficulty and light-headedness.  Psychiatric/Behavioral: Negative for confusion and decreased concentration.  All other systems reviewed and are negative.     Allergies  Shellfish allergy  Home Medications   Prior to Admission medications   Medication Sig Start Date End Date Taking? Authorizing Provider  diazepam (VALIUM) 2 MG tablet Take 1 tablet (2 mg total) by mouth every 8 (eight) hours as needed for  anxiety. 02/14/16 02/13/17  Charmayne Sheer Beers, PA-C  ferrous sulfate (EQL SLOW RELEASE IRON) 160 (50 Fe) MG TBCR SR tablet Take 1 tablet (160 mg total) by mouth daily. 01/22/16   [name], MD  ferrous sulfate 325 (65 FE) MG tablet Take 1 tablet (325 mg total) by mouth daily. 03/01/16 03/01/17  [name], PA-C  levETIRAcetam (KEPPRA) 500 MG tablet Take 1 tablet (500 mg total) by mouth 2 (two) times daily. 01/22/16   [name], MD  meloxicam (MOBIC) 15 MG tablet Take 1 tablet (15 mg total) by mouth daily. 02/14/16   Charmayne Sheer Beers, PA-C   BP 108/67 mmHg  Pulse 80  Temp(Src) 98.2 F (36.8 C) (Oral)  Resp 16  Ht  (1.626 m)  Wt 113.399 kg  BMI 42.89 kg/m2  SpO2 99%  LMP 02/08/2016 Physical Exam  Constitutional: She is oriented to person, place, and time. Vital signs are normal. She appears well-developed and well-nourished.  Non-toxic appearance. She does not have a sickly appearance. She does not appear ill.  Morbidly obese   HENT:  Head: Normocephalic and atraumatic.  Right Ear: Tympanic membrane and external ear normal.  Left Ear: Tympanic membrane and external ear normal.  Nose: Nose normal.  Mouth/Throat: Uvula is midline, oropharynx is clear and moist and mucous membranes are normal.  Eyes: Conjunctivae and EOM are normal. Pupils are equal, round, and reactive to light.  Neck: Normal range of  motion. Neck supple.  Musculoskeletal: Normal range of motion.  Lymphadenopathy:    She has no cervical adenopathy.  Neurological: She is alert and oriented to person, place, and time. She has normal strength. She displays no tremor. No cranial nerve deficit or sensory deficit. She exhibits normal muscle tone. She displays a negative Romberg sign. She displays no seizure activity. Coordination and gait normal. GCS eye subscore is 4. GCS verbal subscore is 5. GCS motor subscore is 6.  Skin: Skin is warm and dry.  Psychiatric: She has a normal mood and affect. Her behavior is  normal. Judgment and thought content normal.  Nursing note and vitals reviewed.   ED Course  Procedures (including critical care time) Labs Review Labs Reviewed - No data to display  Imaging Review No results found. I have personally reviewed and evaluated these images and lab results as part of my medical decision-making.   EKG Interpretation None      MDM  Long discussion with patient about her symptoms. I recommended CBC, CMP and CT of Brain without contrast. She refuses further workup citing wait time. Her symptoms are not consistent with her seizures. Strongly recommended she establish with a primary care physician and that she remain compliant with her Keppra. Paresthesias she is experiencing could be part of a complex migraine syndrome. I wrote her for an iron supplement and also recommend she start a Vitamin B complex over the counter which can often help with paresthesias. She will return here for any change or worsening of condition.  Final diagnoses:  Chronic cluster headache, not intractable  Seizure disorder (HCC)  Numbness and tingling  Anemia, unspecified anemia type        Christella Scheuermannmma Ociel Retherford V, PA-C 03/01/16 1110  Jeanmarie PlantJames A McShane, MD 03/01/16 [id number]

## 2016-03-01 NOTE — Discharge Instructions (Signed)
Try over the counter Vitamin B complex also known as cyanocobalamin   Paresthesia Paresthesia is an abnormal burning or prickling sensation. This sensation is generally felt in the hands, arms, legs, or feet. However, it may occur in any part of the body. Usually, it is not painful. The feeling may be described as:  Tingling or numbness.  Pins and needles.  Skin crawli  g.  Buzzing.  Limbs falling asleep.  Itching. Most people experience temporary (transient) paresthesia at some time in their lives. Paresthesia may occur when you breathe too quickly (hyperventilation). It can also occur without any apparent cause. Commonly, paresthesia occurs when pressure is placed on a nerve. The sensation quickly goes away after the pressure is removed. For some people, however, paresthesia is a long-lasting (chronic) condition that is caused by an underlying disorder. If you continue to have paresthesia, you may need further medical evaluation. HOME CARE INSTRUCTIONS Watch your condition for any changes. Taking the following actions may help to lessen any discomfort that you are feeling:  Avoid drinking alcohol.  Try acupuncture or massage to help relieve your symptoms.  Keep all follow-up visits as directed by your health care provider. This is important. SEEK MEDICAL CARE IF:  You continue to have episodes of paresthesia.  Your burning or prickling feeling gets worse when you walk.  You have pain, cramps, or dizziness.  You develop a rash. SEEK IMMEDIATE MEDICAL CARE IF:  You feel weak.  You have trouble walking or moving.  You have problems with speech, understanding, or vision.  You feel confused.  You cannot control your bladder or bowel movements.  You have numbness after an injury.  You faint.   This information is not intended to replace advice given to you by your health care provider. Make sure you discuss any questions you have with your health care provider.     Document Released: 09/26/2002 Document Revised: 02/20/2015 Document Reviewed: 10/02/2014 Elsevier Interactive Patient Education [employer]2016 Elsevier Inc.

## 2016-03-01 NOTE — ED Notes (Signed)
C/O "tingling" to right forehead.  Sensation has been intermittent but typically noticed before having headache.

## 2016-03-01 NOTE — ED Notes (Signed)
Pt states headache has been "going on forever but she came because of tingling sensation in head". Pt states tingling is on the right side of forehead.

## 2016-03-17 ENCOUNTER — Emergency Department
Admission: EM | Admit: 2016-03-17 | Discharge: 2016-03-17 | Disposition: A | Discharge status: Home / Self Care | Payer: Self-pay | Attending: Emergency Medicine | Admitting: Emergency Medicine

## 2016-03-17 ENCOUNTER — Encounter: Payer: Self-pay | Admitting: Emergency Medicine

## 2016-03-17 ENCOUNTER — Emergency Department: Payer: Self-pay

## 2016-03-17 DIAGNOSIS — R569 Unspecified convulsions: Secondary | ICD-10-CM

## 2016-03-17 DIAGNOSIS — F1219 Cannabis abuse with unspecified cannabis-induced disorder: Secondary | ICD-10-CM | POA: Insufficient documentation

## 2016-03-17 DIAGNOSIS — G40909 Epilepsy, unspecified, not intractable, without status epilepticus: Secondary | ICD-10-CM | POA: Insufficient documentation

## 2016-03-17 DIAGNOSIS — Z79899 Other long term (current) drug therapy: Secondary | ICD-10-CM | POA: Insufficient documentation

## 2016-03-17 LAB — COMPREHENSIVE METABOLIC PANEL
ALT: 15 U/L (ref 14–54)
ANION GAP: 7 (ref 5–15)
AST: 21 U/L (ref 15–41)
Albumin: 3.5 g/dL (ref 3.5–5.0)
Alkaline Phosphatase: 47 U/L (ref 38–126)
BUN: 10 mg/dL (ref 6–20)
CHLORIDE: 104 mmol/L (ref 101–111)
CO2: 25 mmol/L (ref 22–32)
CREATININE: 0.81 mg/dL (ref 0.44–1.00)
Calcium: 8.7 mg/dL — ABNORMAL LOW (ref 8.9–10.3)
Glucose, Bld: 106 mg/dL — ABNORMAL HIGH (ref 65–99)
Potassium: 3.9 mmol/L (ref 3.5–5.1)
SODIUM: 136 mmol/L (ref 135–145)
Total Bilirubin: 0.3 mg/dL (ref 0.3–1.2)
Total Protein: 7.5 g/dL (ref 6.5–8.1)

## 2016-03-17 LAB — CBC WITH DIFFERENTIAL/PLATELET
Basophils Absolute: 0 10*3/uL (ref 0–0.1)
Basophils Relative: 1 %
EOS ABS: 0.1 10*3/uL (ref 0–0.7)
Eosinophils Relative: 3 %
HCT: 31 % — ABNORMAL LOW (ref 35.0–47.0)
Hemoglobin: 9.8 g/dL — ABNORMAL LOW (ref 12.0–16.0)
LYMPHS ABS: 1.5 10*3/uL (ref 1.0–3.6)
MCH: 21.6 pg — AB (ref 26.0–34.0)
MCHC: 31.6 g/dL — AB (ref 32.0–36.0)
MCV: 68.2 fL — AB (ref 80.0–100.0)
MONO ABS: 0.3 10*3/uL (ref 0.2–0.9)
Neutro Abs: 1.6 10*3/uL (ref 1.4–6.5)
Neutrophils Relative %: 46 %
PLATELETS: 500 10*3/uL — AB (ref 150–440)
RBC: 4.55 MIL/uL (ref 3.80–5.20)
RDW: 18.2 % — AB (ref 11.5–14.5)
WBC: 3.6 10*3/uL (ref 3.6–11.0)

## 2016-03-17 MED ORDER — ACETAMINOPHEN 325 MG PO TABS
650.0000 mg | ORAL_TABLET | Freq: Once | ORAL | Status: AC
  Administered 2016-03-17: 650 mg via ORAL
  Filled 2016-03-17: qty 2

## 2016-03-17 MED ORDER — LEVETIRACETAM 750 MG PO TABS: 750.0000 mg | ORAL_TABLET | Freq: Two times a day (BID) | ORAL | Status: DC

## 2016-03-17 NOTE — ED Notes (Signed)
Pt states she is having friend bring her food in, RN informed pt that her imaging has not been completed and it's best to wait until after imaging has resulted. Pt states she needs to eat because she hasn't all day.

## 2016-03-17 NOTE — Discharge Instructions (Signed)

## 2016-03-17 NOTE — ED Provider Notes (Signed)
[facility] Emergency Department Provider Note  ____________________________________________    I have reviewed the triage vital signs and the nursing notes.   HISTORY  Chief Complaint Seizures    HPI Margaret Clark is a 25 y.o. female who presents after a seizure. Patient reports a history of seizure she is on Keppra which she reports compliance with. She takes 500 mg twice a day. She reports this is less than she has taken the past where her seizures were better controlled. She  reports she is having a seizure nearly every week. She reports she had a seizure at work today and think she hit her head because her head and neck hurt. No fevers or chills. No neuro deficits.     Past Medical History  Diagnosis Date  . Seizures (HCC)   . Epilepsia (HCC)     There are no active problems to display for this patient.   History reviewed. No pertinent past surgical history.  Current Outpatient Rx  Name  Route  Sig  Dispense  Refill  . diazepam (VALIUM) 2 MG tablet   Oral   Take 1 tablet (2 mg total) by mouth every 8 (eight) hours as needed for anxiety.   12 tablet   0   . ferrous sulfate (EQL SLOW RELEASE IRON) 160 (50 Fe) MG TBCR SR tablet   Oral   Take 1 tablet (160 mg total) by mouth daily.   30 each   6   . ferrous sulfate 325 (65 FE) MG tablet   Oral   Take 1 tablet (325 mg total) by mouth daily.   30 tablet   0   . levETIRAcetam (KEPPRA) 500 MG tablet   Oral   Take 1 tablet (500 mg total) by mouth 2 (two) times daily.   60 tablet   1   . meloxicam (MOBIC) 15 MG tablet   Oral   Take 1 tablet (15 mg total) by mouth daily.   30 tablet   0     Allergies Shellfish allergy  No family history on file.  Social History Social History  Substance Use Topics  . Smoking status: Never Smoker   . Smokeless tobacco: Never Used  . Alcohol Use: No    Review of Systems  Constitutional: Negative for fever. Eyes: Negative for  redness ENT: Negative for sore throat Cardiovascular: Negative for chest pain Respiratory: Negative for shortness of breath. Gastrointestinal: Negative for abdominal pain Genitourinary: Negative for dysuria. Musculoskeletal: Negative for back pain.Positive for neck pain Skin: Negative for laceration Neurological: Negative for focal weakness, positive for headache Psychiatric: no anxiety    ____________________________________________   PHYSICAL EXAM:  VITAL SIGNS: ED Triage Vitals  Enc Vitals Group     BP 03/17/16 1300 98/60 mmHg     Pulse Rate 03/17/16 1300 70     Resp 03/17/16 1300 18     Temp 03/17/16 1300 98.1 F (36.7 C)     Temp Source 03/17/16 1300 Oral     SpO2 03/17/16 1300 99 %     Weight 03/17/16 1300 250 lb (113.399 kg)     Height 03/17/16 1300  (1.626 m)     Head Cir --      Peak Flow --      Pain Score 03/17/16 1300 9     Pain Loc --      Pain Edu? --      Excl. in GC? --      Constitutional:  Alert and oriented. Well appearing and in no distress.  Eyes: Conjunctivae are normal. No erythema or injection. PERRLA ENT   Head: Normocephalic and atraumatic.   Mouth/Throat: Mucous membranes are moist. Cardiovascular: Normal rate, regular rhythm. Normal and symmetric distal pulses are present in the upper extremities.  Respiratory: Normal respiratory effort without tachypnea nor retractions. Breath sounds are clear and equal bilaterally.  Gastrointestinal: Soft and non-tender in all quadrants. No distention. There is no CVA tenderness. Genitourinary: deferred Musculoskeletal: Nontender with normal range of motion in all extremities. No lower extremity tenderness nor edema. No vertebral tenderness to palpation Neurologic:  Normal speech and language. No gross focal neurologic deficits are appreciated. Skin:  Skin is warm, dry and intact. No rash noted. Psychiatric: Mood and affect are normal. Patient exhibits appropriate insight and  judgment.  ____________________________________________    LABS (pertinent positives/negatives)  Labs Reviewed  CBC WITH DIFFERENTIAL/PLATELET - Abnormal; Notable for the following:    Hemoglobin 9.8 (*)    HCT 31.0 (*)    MCV 68.2 (*)    MCH 21.6 (*)    MCHC 31.6 (*)    RDW 18.2 (*)    Platelets 500 (*)    All other components within normal limits  COMPREHENSIVE METABOLIC PANEL - Abnormal; Notable for the following:    Glucose, Bld 106 (*)    Calcium 8.7 (*)    All other components within normal limits  LEVETIRACETAM LEVEL    ____________________________________________   EKG  None  ____________________________________________    RADIOLOGY  CT head and cervical spine are unremarkable  ____________________________________________   PROCEDURES  Procedure(s) performed: none  Critical Care performed: none  ____________________________________________   INITIAL IMPRESSION / ASSESSMENT AND PLAN / ED COURSE  Pertinent labs & imaging results that were available during my care of the patient were reviewed by me and considered in my medical decision making (see chart for details).  Patient presents after a seizure with complaints of head injury. We will obtain CT head and discussed with neurology whether we should increase her Keppra dosing  Discussed with Dr. Loretha BrasilZeylikman, he agrees with increasing Keppra to 750 twice a day and [facility]  Patient is AAOx4, no seizure activity during ED stay.  ____________________________________________   FINAL CLINICAL IMPRESSION(S) / ED DIAGNOSES  Final diagnoses:  Seizure (HCC)          Jene Everyobert Merel Santoli, MD 03/17/16 1948

## 2016-03-17 NOTE — ED Notes (Signed)
Seizure while at work, history of seizures with recurrence about once a week. States has not missed meds. Alert and oriented on arrival.

## 2016-03-19 LAB — LEVETIRACETAM LEVEL: Levetiracetam Lvl: NOT DETECTED ug/mL (ref 10.0–40.0)

## 2016-03-29 ENCOUNTER — Emergency Department
Admission: EM | Admit: 2016-03-29 | Discharge: 2016-03-29 | Disposition: A | Discharge status: Home / Self Care | Payer: Self-pay | Attending: Emergency Medicine | Admitting: Emergency Medicine

## 2016-03-29 ENCOUNTER — Emergency Department: Payer: Self-pay

## 2016-03-29 DIAGNOSIS — Z8669 Personal history of other diseases of the nervous system and sense organs: Secondary | ICD-10-CM | POA: Insufficient documentation

## 2016-03-29 DIAGNOSIS — F1290 Cannabis use, unspecified, uncomplicated: Secondary | ICD-10-CM | POA: Insufficient documentation

## 2016-03-29 DIAGNOSIS — Z79899 Other long term (current) drug therapy: Secondary | ICD-10-CM | POA: Insufficient documentation

## 2016-03-29 DIAGNOSIS — R52 Pain, unspecified: Secondary | ICD-10-CM | POA: Insufficient documentation

## 2016-03-29 LAB — BASIC METABOLIC PANEL
Anion gap: 5 (ref 5–15)
BUN: 12 mg/dL (ref 6–20)
CALCIUM: 8.8 mg/dL — AB (ref 8.9–10.3)
CO2: 22 mmol/L (ref 22–32)
CREATININE: 0.78 mg/dL (ref 0.44–1.00)
Chloride: 107 mmol/L (ref 101–111)
GFR calc Af Amer: >60 mL/min (ref >60)
GLUCOSE: 92 mg/dL (ref 65–99)
Potassium: 4.2 mmol/L (ref 3.5–5.1)
SODIUM: 134 mmol/L — AB (ref 135–145)

## 2016-03-29 LAB — URINE DRUG SCREEN, QUALITATIVE (ARMC ONLY)
Amphetamines, Ur Screen: NOT DETECTED
Barbiturates, Ur Screen: NOT DETECTED
Benzodiazepine, Ur Scrn: NOT DETECTED
CANNABINOID 50 NG, UR ~~LOC~~: POSITIVE — AB
Cocaine Metabolite,Ur ~~LOC~~: NOT DETECTED
MDMA (ECSTASY) UR SCREEN: NOT DETECTED
Methadone Scn, Ur: NOT DETECTED
OPIATE, UR SCREEN: NOT DETECTED
PHENCYCLIDINE (PCP) UR S: NOT DETECTED
Tricyclic, Ur Screen: NOT DETECTED

## 2016-03-29 LAB — URINALYSIS COMPLETE WITH MICROSCOPIC (ARMC ONLY)
Bilirubin Urine: NEGATIVE
Glucose, UA: NEGATIVE mg/dL
Hgb urine dipstick: NEGATIVE
KETONES UR: NEGATIVE mg/dL
Leukocytes, UA: NEGATIVE
NITRITE: NEGATIVE
PROTEIN: NEGATIVE mg/dL
SPECIFIC GRAVITY, URINE: 1.018 (ref 1.005–1.030)
pH: 6 (ref 5.0–8.0)

## 2016-03-29 LAB — CBC WITH DIFFERENTIAL/PLATELET
Basophils Absolute: 0 10*3/uL (ref 0–0.1)
Basophils Relative: 1 %
EOS ABS: 0.2 10*3/uL (ref 0–0.7)
EOS PCT: 3 %
HCT: 29.9 % — ABNORMAL LOW (ref 35.0–47.0)
Hemoglobin: 9.5 g/dL — ABNORMAL LOW (ref 12.0–16.0)
LYMPHS ABS: 2 10*3/uL (ref 1.0–3.6)
Lymphocytes Relative: 35 %
MCH: 21.5 pg — AB (ref 26.0–34.0)
MCHC: 31.7 g/dL — AB (ref 32.0–36.0)
MCV: 67.7 fL — ABNORMAL LOW (ref 80.0–100.0)
MONO ABS: 0.5 10*3/uL (ref 0.2–0.9)
MONOS PCT: 8 %
Neutro Abs: 3 10*3/uL (ref 1.4–6.5)
Neutrophils Relative %: 53 %
PLATELETS: 425 10*3/uL (ref 150–440)
RBC: 4.42 MIL/uL (ref 3.80–5.20)
RDW: 18.3 % — AB (ref 11.5–14.5)
WBC: 5.7 10*3/uL (ref 3.6–11.0)

## 2016-03-29 LAB — POCT PREGNANCY, URINE: PREG TEST UR: NEGATIVE

## 2016-03-29 MED ORDER — SODIUM CHLORIDE 0.9 % IV BOLUS (SEPSIS)
1000.0000 mL | Freq: Once | INTRAVENOUS | Status: AC
  Administered 2016-03-29: 1000 mL via INTRAVENOUS

## 2016-03-29 MED ORDER — IBUPROFEN 800 MG PO TABS
800.0000 mg | ORAL_TABLET | Freq: Once | ORAL | Status: AC
  Administered 2016-03-29: 800 mg via ORAL
  Filled 2016-03-29: qty 1

## 2016-03-29 NOTE — ED Provider Notes (Signed)
[facility] Emergency Department Provider Note   ____________________________________________  Time seen: Approximately 8 AM I have reviewed the triage vital signs and the triage nursing note.  HISTORY  Chief Complaint No chief complaint on file.   Historian Patient  HPI Margaret Clark is a 25 y.o. female with a history of seizures, for which she over the past several weeks has been increased from 500 twice daily to 750 mg twice daily, and is presenting today for feeling "bad." She describes that she's been having some muscle/body aches all over including the back of her head where she fell after a seizure and was evaluated a few weeks ago in the emergency department with a negative head and cervical spine CT. She denies a fever. She states that she feels thirsty all the time and thinks she might be dehydrated. Denies abdominal pain, vomiting or diarrhea. No significant cough or trouble breathing, but she has had some chest tightness.  Symptoms are mild to moderate. She has not tried taking Tylenol or ibuprofen.  She states her last seizure was 2 days ago, no injuries from that. She states her seizures seem to be under better control since increasing the dose as she previously had at 2 seizures per day. Yesterday was the first seizure since she was seen in the ED previously.  She previously followed with [facility] for neurology, but she has lost her insurance and so she has not followed up.    Past Medical History  Diagnosis Date  . Seizures (HCC)   . Epilepsia (HCC)     There are no active problems to display for this patient.   No past surgical history on file.  Current Outpatient Rx  Name  Route  Sig  Dispense  Refill  . diazepam (VALIUM) 2 MG tablet   Oral   Take 1 tablet (2 mg total) by mouth every 8 (eight) hours as needed for anxiety.   12 tablet   0   . ferrous sulfate (EQL SLOW RELEASE IRON) 160 (50 Fe) MG TBCR SR tablet    Oral   Take 1 tablet (160 mg total) by mouth daily.   30 each   6   . ferrous sulfate 325 (65 FE) MG tablet   Oral   Take 1 tablet (325 mg total) by mouth daily.   30 tablet   0   . levETIRAcetam (KEPPRA) 750 MG tablet   Oral   Take 1 tablet (750 mg total) by mouth 2 (two) times daily.   60 tablet   0   . meloxicam (MOBIC) 15 MG tablet   Oral   Take 1 tablet (15 mg total) by mouth daily.   30 tablet   0     Allergies Shellfish allergy  No family history on file.  Social History Social History  Substance Use Topics  . Smoking status: Never Smoker   . Smokeless tobacco: Never Used  . Alcohol Use: No    Review of Systems  Constitutional: Negative for fever. Eyes: Negative for visual changes. ENT: Negative for sore throat. Cardiovascular: Negative for Pleuritic chest pain. Respiratory: Negative for shortness of breath. Gastrointestinal: Negative for abdominal pain, vomiting and diarrhea. Genitourinary: Negative for dysuria. Musculoskeletal: Negative for back pain. Skin: Negative for rash. Neurological: Negative for headache. 10 point Review of Systems otherwise negative ____________________________________________   PHYSICAL EXAM:  VITAL SIGNS: ED Triage Vitals  Enc Vitals Group     BP 03/29/16 0745 104/72 mmHg  Pulse Rate 03/29/16 0745 86     Resp 03/29/16 0745 20     Temp 03/29/16 0745 98.8 F (37.1 C)     Temp Source 03/29/16 0745 Oral     SpO2 03/29/16 0745 100 %     Weight 03/29/16 0745 250 lb (113.399 kg)     Height 03/29/16 0745  (1.626 m)     Head Cir --      Peak Flow --      Pain Score 03/29/16 0747 8     Pain Loc --      Pain Edu? --      Excl. in GC? --      Constitutional: Alert and oriented. Well appearing and in no distress. HEENT   Head: Normocephalic and atraumatic.      Eyes: Conjunctivae are normal. PERRL. Normal extraocular movements.      Ears:         Nose: No congestion/rhinnorhea.   Mouth/Throat:  Mucous membranes are moist.   Neck: No stridor. Cardiovascular/Chest: Normal rate, regular rhythm.  No murmurs, rubs, or gallops. Respiratory: Normal respiratory effort without tachypnea nor retractions. Breath sounds are clear and equal bilaterally. No wheezes/rales/rhonchi. Gastrointestinal: Soft. No distention, no guarding, no rebound. Nontender.  Obese  Genitourinary/rectal:Deferred Musculoskeletal: Nontender with normal range of motion in all extremities. No joint effusions.  No lower extremity tenderness.  No edema. Neurologic:  Normal speech and language. No gross or focal neurologic deficits are appreciated. Skin:  Skin is warm, dry and intact. No rash noted. Psychiatric: Mood and affect are normal. Speech and behavior are normal. Patient exhibits appropriate insight and judgment.  ____________________________________________   EKG I, Governor Rooks, MD, the attending physician have personally viewed and interpreted all ECGs.  70 bpm. Normal sinus rhythm. Narrow QRS. Normal axis. Normal ST and T-wave ____________________________________________  LABS (pertinent positives/negatives)  Labs Reviewed  URINALYSIS COMPLETEWITH MICROSCOPIC ([facility] ONLY) - Abnormal; Notable for the following:    Color, Urine YELLOW (*)    APPearance HAZY (*)    Bacteria, UA RARE (*)    Squamous Epithelial / LPF 0-5 (*)    All other components within normal limits  URINE DRUG SCREEN, QUALITATIVE ([facility] ONLY) - Abnormal; Notable for the following:    Cannabinoid 50 Ng, Ur [place] POSITIVE (*)    All other components within normal limits  CBC WITH DIFFERENTIAL/PLATELET - Abnormal; Notable for the following:    Hemoglobin 9.5 (*)    HCT 29.9 (*)    MCV 67.7 (*)    MCH 21.5 (*)    MCHC 31.7 (*)    RDW 18.3 (*)    All other components within normal limits  BASIC METABOLIC PANEL - Abnormal; Notable for the following:    Sodium 134 (*)    Calcium 8.8 (*)    All other components within normal limits  POC  URINE PREG, ED  POCT PREGNANCY, URINE    ____________________________________________  RADIOLOGY All Xrays were viewed by me. Imaging interpreted by Radiologist.  Chest two-view: No acute cardio pulmonary disease. __________________________________________  PROCEDURES  Procedure(s) performed: None  Critical Care performed: None  ____________________________________________   ED COURSE / ASSESSMENT AND PLAN  Pertinent labs & imaging results that were available during my care of the patient were reviewed by me and considered in my medical decision making (see chart for details).   This patient is overall well-appearing with no fever and stable vital signs. In terms of her seizures, sound like they've been  actually under better control since her increased dose a few weeks ago. She's not reporting any injury from the seizure 2 days ago.  She is here because she feels bad all over and wants to be checked for dehydration. Clinically she doesn't look too dehydrated, however she states that she is extremely thirsty and I will go ahead and give her a liter of fluids well or obtaining blood work to evaluate for metabolic disturbances.  Overall she is well appearing and I don't really suspect pneumonia, but she is complaining a little bit of chest tightness since I will obtain an EKG as well as a chest x-ray.  Chest x-ray shows no infiltrate. EKG is reassuring. Laboratory studies are also reassuring. I will send a culture as there is rare bacteria, but I don't think it's representative of urinary tract infection based off of the urinalysis.  She is okay for outpatient management.    CONSULTATIONS:   None   Patient / Family / Caregiver informed of clinical course, medical decision-making process, and agree with plan.   I discussed return precautions, follow-up instructions, and discharged instructions with patient and/or  family.   ___________________________________________   FINAL CLINICAL IMPRESSION(S) / ED DIAGNOSES   Final diagnoses:  Body aches              Note: This dictation was prepared with Dragon dictation. Any transcriptional errors that result from this process are unintentional   Governor Rooksebecca Lynasia Meloche, MD 03/29/16 1112

## 2016-03-29 NOTE — ED Notes (Signed)
Patient transported to X-ray 

## 2016-03-29 NOTE — ED Notes (Addendum)
Pt reports having a seizure 2 days ago and since then has not felt right. Takes Keppra for seizures. States her whole body hurts, and pain in back of head. Denies falling.

## 2016-03-29 NOTE — ED Notes (Signed)
Attempted butterfly stick to obtain blood. Unable to obtain. IV in place but does not draw back. Lab called to attempt to collect.

## 2016-03-29 NOTE — Discharge Instructions (Signed)
Although no certain cause was found for your body aches, I suspect it could be having a viral infection. In either case, your exam and evaluation are reassuring in the emergency department today.  Return to the emergency room for any new or worsening condition including fever, trouble breathing, abdominal pain, confusion or altered mental status, or any other symptoms concerning to you.     Pain Without a Known Cause WHAT IS PAIN WITHOUT A KNOWN CAUSE? Pain can occur in any part of the body and can range from mild to severe. Sometimes no cause can be found for why you are having pain. Some types of pain that can occur without a known cause include:   Headache.  Back pain.  Abdominal pain.  Neck pain. HOW IS PAIN WITHOUT A KNOWN CAUSE DIAGNOSED?  Your health care provider will try to find the cause of your pain. This may include:  Physical exam.  Medical history.  Blood tests.  Urine tests.  X-rays. If no cause is found, your health care provider may diagnose you with pain without a known cause.  IS THERE TREATMENT FOR PAIN WITHOUT A CAUSE?  Treatment depends on the kind of pain you have. Your health care provider may prescribe medicines to help relieve your pain.  WHAT CAN I DO AT HOME FOR MY PAIN?   Take medicines only as directed by your health care provider.  Stop any activities that cause pain. During periods of severe pain, bed rest may help.  Try to reduce your stress with activities such as yoga or meditation. Talk to your health care provider for other stress-reducing activity recommendations.  Exercise regularly, if approved by your health care provider.  Eat a healthy diet that includes fruits and vegetables. This may improve pain. Talk to your health care provider if you have any questions about your diet. WHAT IF MY PAIN DOES NOT GET BETTER?  If you have a painful condition and no reason can be found for the pain or the pain gets worse, it is important to  follow up with your health care provider. It may be necessary to repeat tests and look further for a possible cause.    This information is not intended to replace advice given to you by your health care provider. Make sure you discuss any questions you have with your health care provider.   Document Released: 07/01/2001 Document Revised: 10/27/2014 Document Reviewed: 02/21/2014 Elsevier Interactive Patient Education [employer]2016 [employer] Inc.

## 2016-03-29 NOTE — ED Notes (Signed)
Pt verbalized understanding of discharge instructions. NAD at this time. 

## 2016-03-30 LAB — URINE CULTURE

## 2016-05-17 ENCOUNTER — Emergency Department: Payer: Self-pay

## 2016-05-17 ENCOUNTER — Emergency Department
Admission: EM | Admit: 2016-05-17 | Discharge: 2016-05-17 | Disposition: A | Discharge status: Home / Self Care | Payer: Self-pay | Attending: Emergency Medicine | Admitting: Emergency Medicine

## 2016-05-17 ENCOUNTER — Encounter: Payer: Self-pay | Admitting: Emergency Medicine

## 2016-05-17 DIAGNOSIS — R569 Unspecified convulsions: Secondary | ICD-10-CM

## 2016-05-17 DIAGNOSIS — N390 Urinary tract infection, site not specified: Secondary | ICD-10-CM | POA: Insufficient documentation

## 2016-05-17 DIAGNOSIS — G40909 Epilepsy, unspecified, not intractable, without status epilepticus: Secondary | ICD-10-CM | POA: Insufficient documentation

## 2016-05-17 DIAGNOSIS — F1210 Cannabis abuse, uncomplicated: Secondary | ICD-10-CM | POA: Insufficient documentation

## 2016-05-17 LAB — URINE DRUG SCREEN, QUALITATIVE (ARMC ONLY)
Amphetamines, Ur Screen: NOT DETECTED
BARBITURATES, UR SCREEN: NOT DETECTED
Benzodiazepine, Ur Scrn: NOT DETECTED
CANNABINOID 50 NG, UR ~~LOC~~: POSITIVE — AB
COCAINE METABOLITE, UR ~~LOC~~: NOT DETECTED
MDMA (ECSTASY) UR SCREEN: NOT DETECTED
METHADONE SCREEN, URINE: NOT DETECTED
Opiate, Ur Screen: NOT DETECTED
Phencyclidine (PCP) Ur S: NOT DETECTED
TRICYCLIC, UR SCREEN: NOT DETECTED

## 2016-05-17 LAB — URINALYSIS COMPLETE WITH MICROSCOPIC (ARMC ONLY)
Bilirubin Urine: NEGATIVE
GLUCOSE, UA: NEGATIVE mg/dL
Ketones, ur: NEGATIVE mg/dL
LEUKOCYTES UA: NEGATIVE
Nitrite: NEGATIVE
PROTEIN: NEGATIVE mg/dL
SPECIFIC GRAVITY, URINE: 1.015 (ref 1.005–1.030)
pH: 6 (ref 5.0–8.0)

## 2016-05-17 LAB — ETHANOL

## 2016-05-17 LAB — PREGNANCY, URINE: PREG TEST UR: NEGATIVE

## 2016-05-17 LAB — VALPROIC ACID LEVEL

## 2016-05-17 LAB — GLUCOSE, CAPILLARY: GLUCOSE-CAPILLARY: 100 mg/dL — AB (ref 65–99)

## 2016-05-17 MED ORDER — SODIUM CHLORIDE 0.9 % IV BOLUS (SEPSIS): 1000.0000 mL | Freq: Once | INTRAVENOUS | Status: DC

## 2016-05-17 MED ORDER — CEPHALEXIN 500 MG PO CAPS: 500.0000 mg | ORAL_CAPSULE | Freq: Three times a day (TID) | ORAL | 0 refills | Status: DC

## 2016-05-17 MED ORDER — CEPHALEXIN 500 MG PO CAPS
500.0000 mg | ORAL_CAPSULE | Freq: Once | ORAL | Status: AC
  Administered 2016-05-17: 500 mg via ORAL
  Filled 2016-05-17: qty 1

## 2016-05-17 MED ORDER — LEVETIRACETAM 500 MG PO TABS
ORAL_TABLET | ORAL | Status: AC
  Administered 2016-05-17: 750 mg via ORAL
  Filled 2016-05-17: qty 2

## 2016-05-17 MED ORDER — LEVETIRACETAM 750 MG PO TABS
750.0000 mg | ORAL_TABLET | Freq: Once | ORAL | Status: AC
  Administered 2016-05-17: 750 mg via ORAL

## 2016-05-17 MED ORDER — SODIUM CHLORIDE 0.9 % IV BOLUS (SEPSIS)
1000.0000 mL | Freq: Once | INTRAVENOUS | Status: AC
  Administered 2016-05-17: 1000 mL via INTRAVENOUS

## 2016-05-17 NOTE — ED Triage Notes (Signed)
EMS report pt. Had three seizures tonight about 1 minute a part, each lasting about 30-40 seconds.  Pt. States last seizure was about 1 month ago.  Pt. States she takes keppra.

## 2016-05-17 NOTE — ED Provider Notes (Signed)
[facility] Emergency Department Provider Note   ____________________________________________   First MD Initiated Contact with Patient 05/17/16 [phone]     (approximate)  I have reviewed the triage vital signs and the nursing notes.   HISTORY  Chief Complaint Seizures (Pt. states multiple seizures this evening. Pt. is on Keppra)    HPI Margaret Clark is a 25 y.o. 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 Patient has a history of epilepsy, taking Keppra 750 mg twice daily. Reports last seizure approximately 6 weeks ago when her Keppra dose was increased from 500 to 750 mg. Prior to that patient was having frequent seizures. Patient reports she has aura before seizure and knew that she was going to have one tonight.She reportedly had 3 seizures back-to-back lasting approximately 1 minute each. Did not bite tongue or have urinary incontinence. EMS reports patient was postictal upon their arrival. She arrives to the ED alert, oriented and in no acute distress. Complains of mild left hip pain but was ambulatory to the ambulance. Does note slight cough and congestion this past week. Denies recent fever, chills, chest pain, shortness of breath, abdominal pain, nausea, vomiting, diarrhea, dysuria, headache, vision changes, numbness/tingling, extremity weakness. Denies recent travel or trauma. Denies recent tick bite. Nothing makes her symptoms better or worse.   Past Medical History:  Diagnosis Date  . Epilepsia (HCC)   . Seizures (HCC)     There are no active problems to display for this patient.   History reviewed. No pertinent surgical history.  Prior to Admission medications   Medication Sig Start Date End Date Taking? Authorizing Provider  cephALEXin (KEFLEX) 500 MG capsule Take 1 capsule (500 mg total) by mouth 3 (three) times daily. 05/17/16   [name], MD  diazepam (VALIUM) 2 MG tablet Take 1 tablet  (2 mg total) by mouth every 8 (eight) hours as needed for anxiety. 02/14/16 02/13/17  Charmayne Sheer Beers, PA-C  ferrous sulfate (EQL SLOW RELEASE IRON) 160 (50 Fe) MG TBCR SR tablet Take 1 tablet (160 mg total) by mouth daily. 01/22/16   [name], MD  ferrous sulfate 325 (65 FE) MG tablet Take 1 tablet (325 mg total) by mouth daily. 03/01/16 03/01/17  [name], PA-C  levETIRAcetam (KEPPRA) 750 MG tablet Take 1 tablet (750 mg total) by mouth 2 (two) times daily. 03/17/16   [name], MD  meloxicam (MOBIC) 15 MG tablet Take 1 tablet (15 mg total) by mouth daily. 02/14/16   [name], PA-C    Allergies Shellfish allergy  No family history on file.  Social History Social History  Substance Use Topics  . Smoking status: Never Smoker  . Smokeless tobacco: Never Used  . Alcohol use Yes    Review of Systems  Constitutional: No fever/chills. Eyes: No visual changes. ENT: No sore throat. Cardiovascular: Denies chest pain. Respiratory: Positive for cough. Denies shortness of breath. Gastrointestinal: No abdominal pain.  No nausea, no vomiting.  No diarrhea.  No constipation. Genitourinary: Negative for dysuria. Musculoskeletal: Negative for back pain. Skin: Negative for rash. Neurological: Positive for seizures. Negative for headaches, focal weakness or numbness.  10-point ROS otherwise negative.  ____________________________________________   PHYSICAL EXAM:  VITAL SIGNS: ED Triage Vitals  Enc Vitals Group     BP 05/17/16 0026 (!) 161/80     Pulse Rate 05/17/16 0026 89     Resp --  Temp 05/17/16 0026 98.6 F (37 C)     Temp Source 05/17/16 0026 Oral     SpO2 05/17/16 0026 99 %     Weight 05/17/16 0026 245 lb (111.1 kg)     Height 05/17/16 0026 5\' 4"  (1.626 m)     Head Circumference --      Peak Flow --      Pain Score 05/17/16 0027 8     Pain Loc --      Pain Edu? --      Excl. in GC? --     Constitutional: Alert and oriented. Well appearing and  in no acute distress. Eyes: Conjunctivae are normal. PERRL. EOMI. Head: Atraumatic. Nose: No congestion/rhinnorhea. Mouth/Throat: Mucous membranes are moist.  Oropharynx non-erythematous. Neck: No stridor.  No cervical spine tenderness to palpation. Cardiovascular: Normal rate, regular rhythm. Grossly normal heart sounds.  Good peripheral circulation. Respiratory: Normal respiratory effort.  No retractions. Lungs CTAB. Gastrointestinal: Soft and nontender. No distention. No abdominal bruits. No CVA tenderness. Musculoskeletal: No lower extremity tenderness nor edema.  No joint effusions.  FROM left hip. Neurologic:  Normal speech and language. No gross focal neurologic deficits are appreciated.  Skin:  Skin is warm, dry and intact. No rash noted. Psychiatric: Mood and affect are normal. Speech and behavior are normal.  ____________________________________________   LABS (all labs ordered are listed, but only abnormal results are displayed)  Labs Reviewed  GLUCOSE, CAPILLARY - Abnormal; Notable for the following:       Result Value   Glucose-Capillary 100 (*)    All other components within normal limits  VALPROIC ACID LEVEL - Abnormal; Notable for the following:    Valproic Acid Lvl <10 (*)    All other components within normal limits  URINALYSIS COMPLETEWITH MICROSCOPIC ([facility] ONLY) - Abnormal; Notable for the following:    Color, Urine YELLOW (*)    APPearance CLOUDY (*)    Hgb urine dipstick 2+ (*)    Bacteria, UA RARE (*)    Squamous Epithelial / LPF 6-30 (*)    All other components within normal limits  URINE DRUG SCREEN, QUALITATIVE ([facility] ONLY) - Abnormal; Notable for the following:    Cannabinoid 50 Ng, Ur [place] POSITIVE (*)    All other components within normal limits  ETHANOL  PREGNANCY, URINE  CBG MONITORING, ED  POC URINE PREG, ED   ____________________________________________  EKG  ED ECG REPORT I, Fender Herder J, the attending physician, personally viewed and  interpreted this ECG.   Date: 05/17/2016  EKG Time: 0030  Rate: 94  Rhythm: normal EKG, normal sinus rhythm  Axis: Normal  Intervals:none  ST&T Change: Nonspecific  ____________________________________________  RADIOLOGY  Portable chest x-ray (viewed by me, interpreted per Dr. Gwenyth Bender): No active disease. ____________________________________________   PROCEDURES  Procedure(s) performed: None  Procedures  Critical Care performed: No  ____________________________________________   INITIAL IMPRESSION / ASSESSMENT AND PLAN / ED COURSE  Pertinent labs & imaging results that were available during my care of the patient were reviewed by me and considered in my medical decision making (see chart for details).  25 year old female with a history of epilepsy, on Keppra who presents with seizure. Patient is alert, oriented and in no acute distress on arrival. Will check screening lab work, urinalysis, chest x-ray for complaints of nonproductive cough, and reassess.  Clinical Course  Comment By Time  Updated patient on laboratory and urinalysis results. Will initiate Keflex for UTI. Patient given her nighttime dose of Keppra. Advised  patient to avoid driving or operating heavy machinery until she is cleared by neurology. Also advised her to avoid illicit substances as these could lower her seizure threshold. Strictprecautions given. Patient verbalizes understanding and agrees with plan of care. [name], MD 07/29 0344     ____________________________________________   FINAL CLINICAL IMPRESSION(S) / ED DIAGNOSES  Final diagnoses:  Seizure (HCC)  UTI (lower urinary tract infection)  Marijuana abuse      NEW MEDICATIONS STARTED DURING THIS VISIT:  New Prescriptions   CEPHALEXIN (KEFLEX) 500 MG CAPSULE    Take 1 capsule (500 mg total) by mouth 3 (three) times daily.     Note:  This document was prepared using Dragon voice recognition software and may include  unintentional dictation errors.    [name], MD 05/17/16 [phone]

## 2016-05-17 NOTE — Discharge Instructions (Signed)
1. Take antibiotic as prescribed (Keflex 500 mg 3 times daily 7 days). 2. Do not drive or operate heavy machinery until cleared by the neurologist. 3. Go ahead and call the number provided to establish appointment with the neurologist (seizure specialist). It may be several weeks to months before your appointment. Return to the ER for recurrent or worsening symptoms, persistent vomiting, difficult breathing or other concerns.

## 2016-05-31 ENCOUNTER — Emergency Department
Admission: EM | Admit: 2016-05-31 | Discharge: 2016-05-31 | Disposition: A | Discharge status: Home / Self Care | Payer: Medicaid Other | Attending: Emergency Medicine | Admitting: Emergency Medicine

## 2016-05-31 DIAGNOSIS — Z79899 Other long term (current) drug therapy: Secondary | ICD-10-CM | POA: Insufficient documentation

## 2016-05-31 DIAGNOSIS — G40909 Epilepsy, unspecified, not intractable, without status epilepticus: Secondary | ICD-10-CM | POA: Insufficient documentation

## 2016-05-31 DIAGNOSIS — R569 Unspecified convulsions: Secondary | ICD-10-CM

## 2016-05-31 LAB — BASIC METABOLIC PANEL
ANION GAP: 6 (ref 5–15)
BUN: 11 mg/dL (ref 6–20)
CALCIUM: 8.7 mg/dL — AB (ref 8.9–10.3)
CO2: 25 mmol/L (ref 22–32)
CREATININE: 0.8 mg/dL (ref 0.44–1.00)
Chloride: 106 mmol/L (ref 101–111)
GFR calc Af Amer: >60 mL/min (ref >60)
GLUCOSE: 99 mg/dL (ref 65–99)
Potassium: 3.5 mmol/L (ref 3.5–5.1)
Sodium: 137 mmol/L (ref 135–145)

## 2016-05-31 LAB — CBC WITH DIFFERENTIAL/PLATELET
BASOS ABS: 0 10*3/uL (ref 0–0.1)
BASOS PCT: 1 %
EOS ABS: 0.1 10*3/uL (ref 0–0.7)
EOS PCT: 2 %
HCT: 28.9 % — ABNORMAL LOW (ref 35.0–47.0)
HEMOGLOBIN: 9.4 g/dL — AB (ref 12.0–16.0)
Lymphocytes Relative: 38 %
Lymphs Abs: 1.9 10*3/uL (ref 1.0–3.6)
MCH: 22 pg — AB (ref 26.0–34.0)
MCHC: 32.7 g/dL (ref 32.0–36.0)
MCV: 67.2 fL — ABNORMAL LOW (ref 80.0–100.0)
Monocytes Absolute: 0.4 10*3/uL (ref 0.2–0.9)
Monocytes Relative: 8 %
NEUTROS PCT: 51 %
Neutro Abs: 2.7 10*3/uL (ref 1.4–6.5)
PLATELETS: 381 10*3/uL (ref 150–440)
RBC: 4.3 MIL/uL (ref 3.80–5.20)
RDW: 17.8 % — ABNORMAL HIGH (ref 11.5–14.5)
WBC: 5.2 10*3/uL (ref 3.6–11.0)

## 2016-05-31 MED ORDER — LEVETIRACETAM 500 MG PO TABS
1000.0000 mg | ORAL_TABLET | Freq: Once | ORAL | Status: AC
  Administered 2016-05-31: 1000 mg via ORAL
  Filled 2016-05-31: qty 2

## 2016-05-31 NOTE — ED Triage Notes (Signed)
Pt arrives via  EMS for seizure lasting about 3 mins.  Pt arrives alert and oriented.

## 2016-05-31 NOTE — ED Notes (Signed)
Pt unable to provide urine specimen at this time

## 2016-05-31 NOTE — ED Provider Notes (Signed)
[facility] Emergency Department Provider Note  ____________________________________________  Time seen: Approximately 9:18 AM  I have reviewed the triage vital signs and the nursing notes.   HISTORY  Chief Complaint Seizures    HPI Margaret Clark is a 25 y.o. female brought to the ED by EMS due to seizure. She has a history of seizures and takes Keppra 750 twice a day for it. She also reports that the last 2 or 3 days she has had a headache. She has chronic recurrent headaches and this subacute headache was a diffuse pressure sensation around her entire head and is entirely consistent with her chronic headache pattern without any irregular features. No numbness tingling weakness vomiting or vision changes. However she has had decreased appetite over the last 2 days because of it and last night she had a hard time sleeping and did not actually go to sleep until 5:00 AM. No recent tobacco alcohol or drug use although she does occasionally use marijuana. Currently, she complainsof a bilateral frontal headache which she says is also typical for her after she has a seizure.  EMS reported that the patient had a generalized seizure lasting 2 or 3 minutes, resolve spontaneously.  Patient reports compliance with her medications but has not yet taken her Keppra this morning.  LMP started 3 days ago.   Past Medical History:  Diagnosis Date  . Epilepsia (HCC)   . Seizures (HCC)      There are no active problems to display for this patient.    No past surgical history on file. None  Prior to Admission medications   Medication Sig Start Date End Date Taking? Authorizing Provider  levETIRAcetam (KEPPRA) 750 MG tablet Take 1 tablet (750 mg total) by mouth 2 (two) times daily. 03/17/16  Yes Jene Everyobert Kinner, MD  cephALEXin (KEFLEX) 500 MG capsule Take 1 capsule (500 mg total) by mouth 3 (three) times daily. Patient not taking: Reported on 05/31/2016 05/17/16   Irean HongJade  J Sung, MD  diazepam (VALIUM) 2 MG tablet Take 1 tablet (2 mg total) by mouth every 8 (eight) hours as needed for anxiety. Patient not taking: Reported on 05/31/2016 02/14/16 02/13/17  Charmayne Sheerharles M Beers, PA-C  ferrous sulfate (EQL SLOW RELEASE IRON) 160 (50 Fe) MG TBCR SR tablet Take 1 tablet (160 mg total) by mouth daily. Patient not taking: Reported on 05/31/2016 01/22/16   Emily FilbertJonathan E Williams, MD  ferrous sulfate 325 (65 FE) MG tablet Take 1 tablet (325 mg total) by mouth daily. Patient not taking: Reported on 05/31/2016 03/01/16 03/01/17  Christella ScheuermannEmma V Lawrence, PA-C  meloxicam (MOBIC) 15 MG tablet Take 1 tablet (15 mg total) by mouth daily. Patient not taking: Reported on 05/31/2016 02/14/16   Evangeline Dakinharles M Beers, PA-C     Allergies Shellfish allergy   No family history on file.  Social History Social History  Substance Use Topics  . Smoking status: Never Smoker  . Smokeless tobacco: Never Used  . Alcohol use Yes    Review of Systems  Constitutional:   No fever or chills.  ENT:   No sore throat. No rhinorrhea. Cardiovascular:   Anterior chest tightness after the seizure. No shortness of breath, not pleuritic, not exertional. Nonradiating. No vomiting diaphoresis or dizziness. Mild intensity. No aggravating or alleviating factors.Marland Kitchen. Respiratory:   No dyspnea or cough. Gastrointestinal:   Negative for abdominal pain, vomiting and diarrhea.  Genitourinary:   Negative for dysuria or difficulty urinating. Musculoskeletal:   Negative for focal pain  or swelling Neurological:   Negative for headaches 10-point ROS otherwise negative.  ____________________________________________   PHYSICAL EXAM:  VITAL SIGNS: ED Triage Vitals [05/31/16 0916]  Enc Vitals Group     BP 108/77     Pulse Rate 74     Resp 16     Temp      Temp src      SpO2 97 %     Weight      Height      Head Circumference      Peak Flow      Pain Score      Pain Loc      Pain Edu?      Excl. in GC?     Vital signs  reviewed, nursing assessments reviewed.   Constitutional:   Alert and oriented. Well appearing and in no distress. Eyes:   No scleral icterus. No conjunctival pallor. PERRL. EOMI.  No nystagmus. ENT   Head:   Normocephalic and atraumatic.   Nose:   No congestion/rhinnorhea. No septal hematoma   Mouth/Throat:   MMM, no pharyngeal erythema. No peritonsillar mass. No intraoral injuries   Neck:   No stridor. No SubQ emphysema. No meningismus. Hematological/Lymphatic/Immunilogical:   No cervical lymphadenopathy. Cardiovascular:   RRR. Symmetric bilateral radial and DP pulses.  No murmurs.  Respiratory:   Normal respiratory effort without tachypnea nor retractions. Breath sounds are clear and equal bilaterally. No wheezes/rales/rhonchi. Gastrointestinal:   Soft and nontender. Non distended. There is no CVA tenderness.  No rebound, rigidity, or guarding. Genitourinary:   deferred Musculoskeletal:   Nontender with normal range of motion in all extremities. No joint effusions.  No lower extremity tenderness.  No edema.Anterior chest wall tender in the area of indicated pain, reproduces the pain, over the superior sternum. Neurologic:   Normal speech and language.  CN 2-10 normal. Motor grossly intact. No gross focal neurologic deficits are appreciated.  Skin:    Skin is warm, dry and intact. No rash noted.  No petechiae, purpura, or bullae.  ____________________________________________    LABS (pertinent positives/negatives) (all labs ordered are listed, but only abnormal results are displayed) Labs Reviewed  BASIC METABOLIC PANEL - Abnormal; Notable for the following:       Result Value   Calcium 8.7 (*)    All other components within normal limits  CBC WITH DIFFERENTIAL/PLATELET - Abnormal; Notable for the following:    Hemoglobin 9.4 (*)    HCT 28.9 (*)    MCV 67.2 (*)    MCH 22.0 (*)    RDW 17.8 (*)    All other components within normal limits  URINALYSIS COMPLETEWITH  MICROSCOPIC ([facility] ONLY)  POC URINE PREG, ED   ____________________________________________   EKG    ____________________________________________    RADIOLOGY    ____________________________________________   PROCEDURES Procedures  ____________________________________________   INITIAL IMPRESSION / ASSESSMENT AND PLAN / ED COURSE  Pertinent labs & imaging results that were available during my care of the patient were reviewed by me and considered in my medical decision making (see chart for details).  Patient well appearing no acute distress, presents after a seizure that was brief and resolved spontaneously. She had preceding headache that is similar to her established recurrent headache pattern and currently has a headache that is typical for her as well. No neuro imaging warranted at this time. Patient appears to be back to baseline, not postictal, no intraoral injuries. We'll treat symptomatically for the headache, give her Keppra, and  check labs. Since she had a seizure this morning, I'll give her 1 g of Keppra instead of her usual 750 in hopes of reducing risk for a short-term recurrent seizure.    ----------------------------------------- 12:31 PM on 05/31/2016 -----------------------------------------  No seizures in ED. Feels totally fine. Tolerating oral intake. Labs unremarkable. Refuses to provide urine and wishes to be discharged that she feels much better. Return precautions given. Has enough of her Keppra. Referral information given for outpatient neurology. Work note provided.   Clinical Course   ____________________________________________   FINAL CLINICAL IMPRESSION(S) / ED DIAGNOSES  Final diagnoses:  Seizure (HCC)       Portions of this note were generated with dragon dictation software. Dictation errors may occur despite best attempts at proofreading.    Sharman Cheek, MD 05/31/16 1231

## 2016-05-31 NOTE — ED Notes (Signed)
Pt refuses to give urine sample, MD made aware

## 2016-07-03 ENCOUNTER — Encounter: Payer: Self-pay | Admitting: Emergency Medicine

## 2016-07-03 ENCOUNTER — Emergency Department
Admission: EM | Admit: 2016-07-03 | Discharge: 2016-07-03 | Disposition: A | Discharge status: Home / Self Care | Payer: BLUE CROSS/BLUE SHIELD | Attending: Emergency Medicine | Admitting: Emergency Medicine

## 2016-07-03 ENCOUNTER — Emergency Department: Payer: BLUE CROSS/BLUE SHIELD

## 2016-07-03 DIAGNOSIS — M25522 Pain in left elbow: Secondary | ICD-10-CM | POA: Insufficient documentation

## 2016-07-03 DIAGNOSIS — G40909 Epilepsy, unspecified, not intractable, without status epilepticus: Secondary | ICD-10-CM | POA: Diagnosis not present

## 2016-07-03 DIAGNOSIS — X501XXA Overexertion from prolonged static or awkward postures, initial encounter: Secondary | ICD-10-CM | POA: Diagnosis not present

## 2016-07-03 DIAGNOSIS — Y999 Unspecified external cause status: Secondary | ICD-10-CM | POA: Diagnosis not present

## 2016-07-03 DIAGNOSIS — Y939 Activity, unspecified: Secondary | ICD-10-CM | POA: Diagnosis not present

## 2016-07-03 DIAGNOSIS — S4992XA Unspecified injury of left shoulder and upper arm, initial encounter: Secondary | ICD-10-CM | POA: Diagnosis present

## 2016-07-03 DIAGNOSIS — Y929 Unspecified place or not applicable: Secondary | ICD-10-CM | POA: Insufficient documentation

## 2016-07-03 MED ORDER — DIAZEPAM 2 MG PO TABS: 2.0000 mg | ORAL_TABLET | Freq: Three times a day (TID) | ORAL | 0 refills | Status: DC | PRN

## 2016-07-03 MED ORDER — NAPROXEN 500 MG PO TABS: 500.0000 mg | ORAL_TABLET | Freq: Two times a day (BID) | ORAL | 0 refills | Status: DC

## 2016-07-03 NOTE — ED Notes (Signed)
See triage note  Having left elbow pain s/p sz yesterday   Increased pain with movement

## 2016-07-03 NOTE — ED Notes (Signed)
Pt discharged home after verbalizing understanding of discharge instructions; nad noted. 

## 2016-07-03 NOTE — ED Provider Notes (Signed)
[facility] Emergency Department Provider Note ____________________________________________  Time seen: Approximately 3:55 PM  I have reviewed the triage vital signs and the nursing notes.   HISTORY  Chief Complaint Arm Injury    HPI Margaret Clark is a 25 y.o. female presents to the emergency department for evaluation of left elbow pain. She states that she had a seizure yesterday and when she awakened her left arm was twisted underneath her. She states that [facility] is [facility] and seizure management. She denies changes in the frequency or flank/severity of the seizures. She does not want evaluation for seizures today. She states that her left elbow hurts worse with full extension and rotation of her left wrist. She has attempted to ice it and take Tylenol and ibuprofen without relief.  Past Medical History:  Diagnosis Date  . Epilepsia (HCC)   . Seizures (HCC)     There are no active problems to display for this patient.   History reviewed. No pertinent surgical history.  Prior to Admission medications   Medication Sig Start Date End Date Taking? Authorizing Provider  cephALEXin (KEFLEX) 500 MG capsule Take 1 capsule (500 mg total) by mouth 3 (three) times daily. Patient not taking: Reported on 05/31/2016 05/17/16   Irean HongJade J Sung, MD  diazepam (VALIUM) 2 MG tablet Take 1 tablet (2 mg total) by mouth every 8 (eight) hours as needed. 07/03/16   Chinita Pesterari B Olegario Emberson, FNP  ferrous sulfate (EQL SLOW RELEASE IRON) 160 (50 Fe) MG TBCR SR tablet Take 1 tablet (160 mg total) by mouth daily. Patient not taking: Reported on 05/31/2016 01/22/16   Emily FilbertJonathan E Williams, MD  ferrous sulfate 325 (65 FE) MG tablet Take 1 tablet (325 mg total) by mouth daily. Patient not taking: Reported on 05/31/2016 03/01/16 03/01/17  Christella ScheuermannEmma V Lawrence, PA-C  levETIRAcetam (KEPPRA) 750 MG tablet Take 1 tablet (750 mg total) by mouth 2 (two) times daily.  03/17/16   Jene Everyobert Kinner, MD  meloxicam (MOBIC) 15 MG tablet Take 1 tablet (15 mg total) by mouth daily. Patient not taking: Reported on 05/31/2016 02/14/16   Charmayne Sheerharles M Beers, PA-C  naproxen (NAPROSYN) 500 MG tablet Take 1 tablet (500 mg total) by mouth 2 (two) times daily with a meal. 07/03/16   Chinita Pesterari B Addilynne Olheiser, FNP    Allergies Shellfish allergy  No family history on file.  Social History Social History  Substance Use Topics  . Smoking status: Never Smoker  . Smokeless tobacco: Never Used  . Alcohol use Yes    Review of Systems Constitutional: Seizure yesterday otherwise no recent illness  Cardiovascular: Denies chest pain or palpitations. Respiratory: Denies shortness of breath. Musculoskeletal: Pain in Left elbow Skin: Negative for rash, wound, lesion. Neurological: Negative for focal weakness or numbness.  ____________________________________________   PHYSICAL EXAM:  VITAL SIGNS: ED Triage Vitals  Enc Vitals Group     BP --      Pulse Rate 07/03/16 1544 80     Resp 07/03/16 1544 18     Temp 07/03/16 1544 98.2 F (36.8 C)     Temp Source 07/03/16 1544 Oral     SpO2 07/03/16 1544 99 %     Weight 07/03/16 1545 250 lb (113.4 kg)     Height 07/03/16 1545 5\' 4"  (1.626 m)     Head Circumference --      Peak Flow --      Pain Score 07/03/16 1545 9  Pain Loc --      Pain Edu? --      Excl. in GC? --     Constitutional: Alert and oriented. Well appearing and in no acute distress. Eyes: Conjunctivae are normal. EOMI. Head: Atraumatic. Neck: No stridor.  Respiratory: Normal respiratory effort.   Musculoskeletal: Pain over the radial head with attempt to fully extend the left elbow. No obvious deformity. No pain in the left shoulder or wrist. Neurologic:  Normal speech and language. No gross focal neurologic deficits are appreciated. Speech is normal. No gait instability. Skin:  Skin is warm, dry and intact. Atraumatic. Psychiatric: Mood and affect are normal.  Speech and behavior are normal.  ____________________________________________   LABS (all labs ordered are listed, but only abnormal results are displayed)  Labs Reviewed - No data to display ____________________________________________  RADIOLOGY  Left elbow negative for acute bony abnormality per radiology. I, Kem Boroughs, personally viewed and evaluated these images (plain radiographs) as part of my medical decision making, as well as reviewing the written report by the radiologist.   ____________________________________________   PROCEDURES  Procedure(s) performed: Sling applied to the left shoulder by ER tech for comfort. Patient was given warnings to remove it often and wearing no longer than 3 days.   ____________________________________________   INITIAL IMPRESSION / ASSESSMENT AND PLAN / ED COURSE  Clinical Course    Pertinent labs & imaging results that were available during my care of the patient were reviewed by me and considered in my medical decision making (see chart for details).  Patient given strict return precautions. She was advised to insure that she keeps her appointment with Deretha Emory. She was advised to take the Valium and Naprosyn as needed. ____________________________________________   FINAL CLINICAL IMPRESSION(S) / ED DIAGNOSES  Final diagnoses:  Arm injury, left, initial encounter       Chinita Pester, FNP 07/03/16 1759    Minna Antis, MD 07/03/16 2308

## 2016-07-03 NOTE — ED Triage Notes (Signed)
Pain in left elbow.  States she has seizures often and fell yesterday while having one which caused the injury.  States she does not want to be seen for the seizure as this is not new for her.

## 2016-07-04 IMAGING — CT CT HEAD W/O CM
2 of 5 series · 14 of 47 positions shown, 17 images · non-contrast
Comparison: None.

CLINICAL DATA: Seizure.  Neck injury from fall

EXAM:
CT HEAD WITHOUT CONTRAST
CT CERVICAL SPINE WITHOUT CONTRAST
TECHNIQUE: Multidetector CT imaging of the head and cervical spine was
performed following the standard protocol without intravenous
contrast. Multiplanar CT image reconstructions of the cervical spine
were also generated.

[Series 5: soft tissue · axial · 0.35mm/px · z∈[-283,-123]mm · 11 of 96 slices shown, 14 images]
[im 8/96  brain]
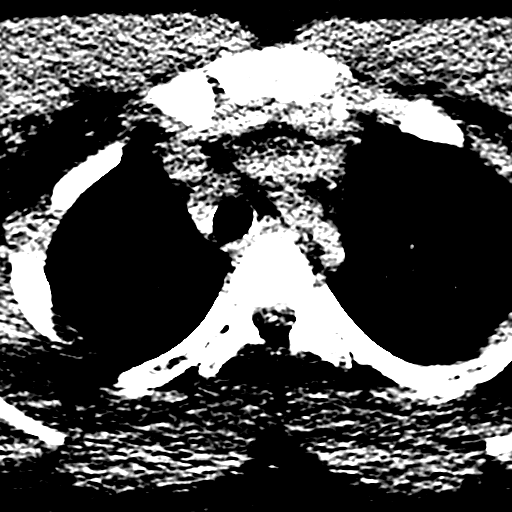
[im 8/96  bone]
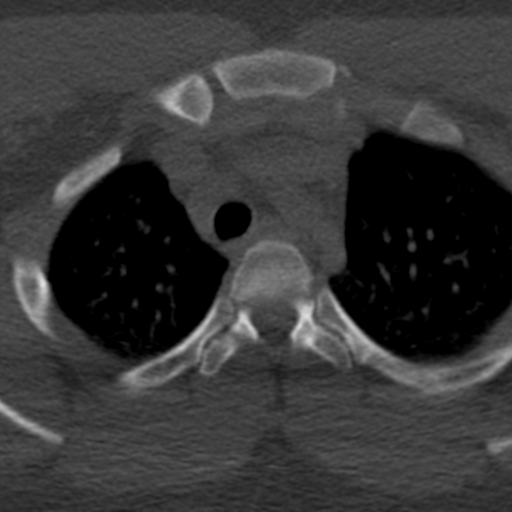
[im 16/96  brain]
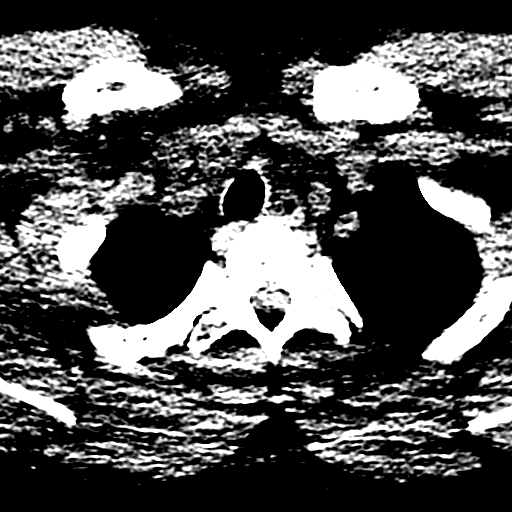
[im 24/96  brain]
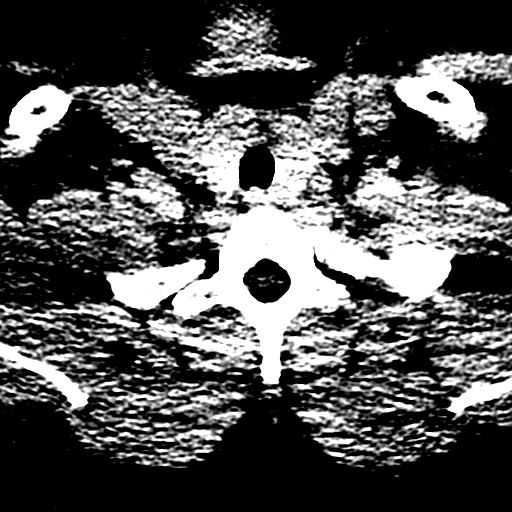
[im 32/96  brain]
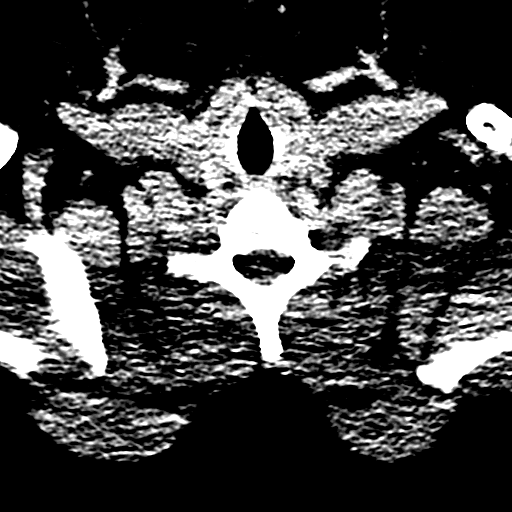
[im 40/96  brain]
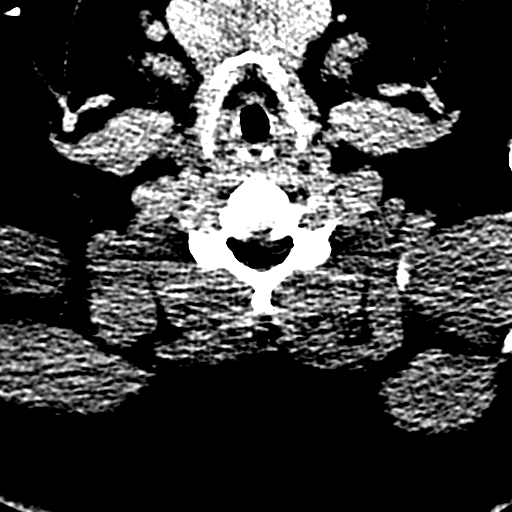
[im 40/96  bone]
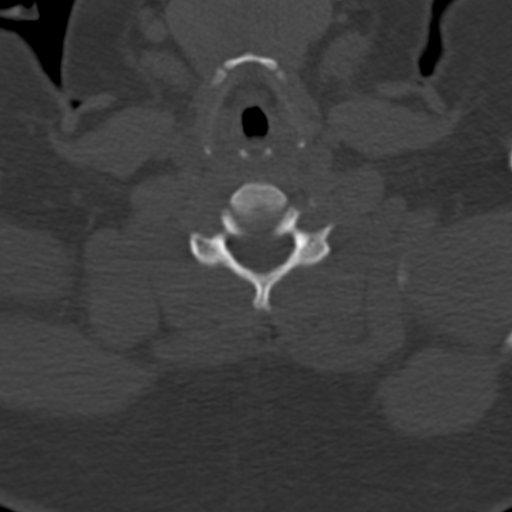
[im 48/96  brain]
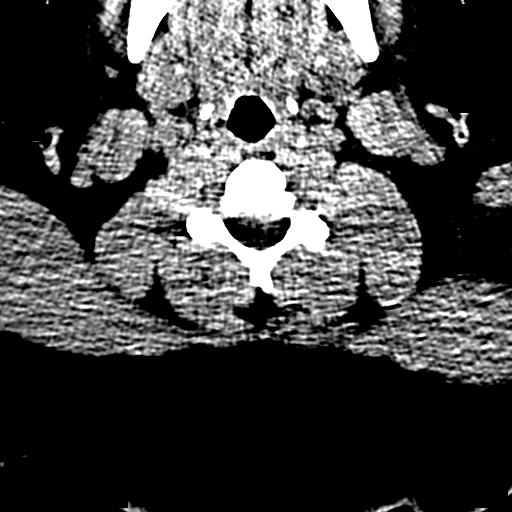
[im 56/96  brain]
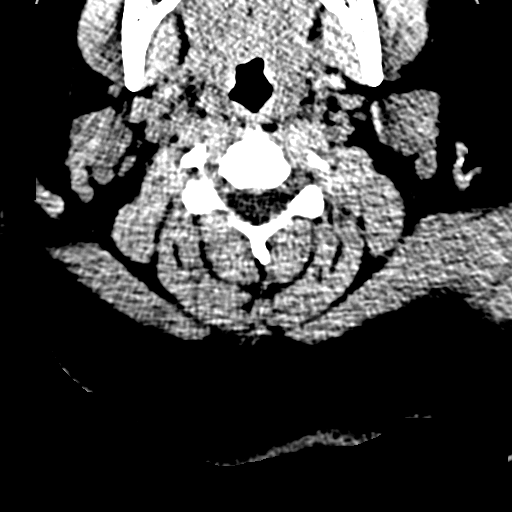
[im 64/96  brain]
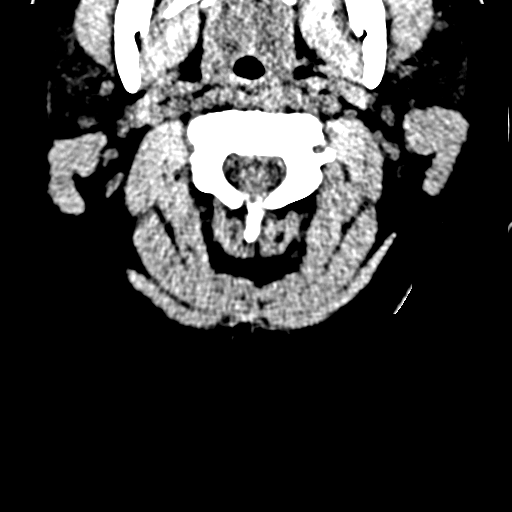
[im 72/96  brain]
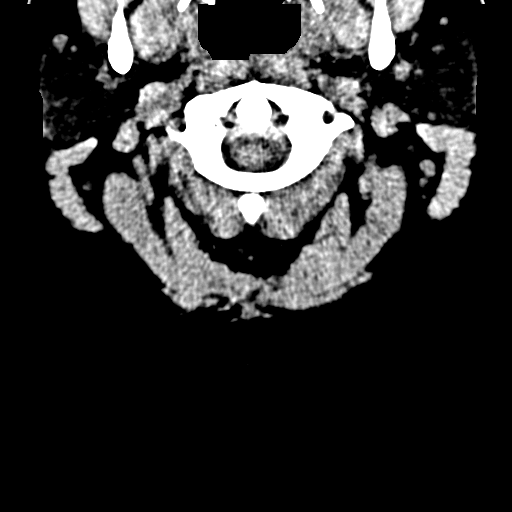
[im 72/96  bone]
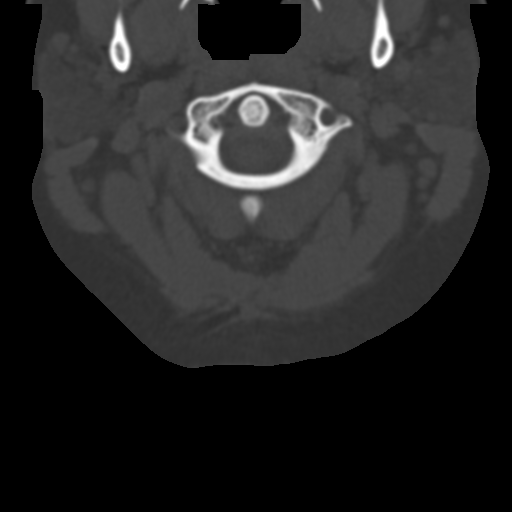
[im 80/96  brain]
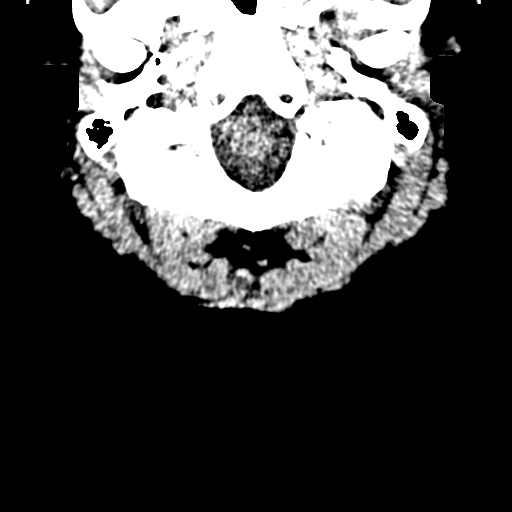
[im 88/96  brain]
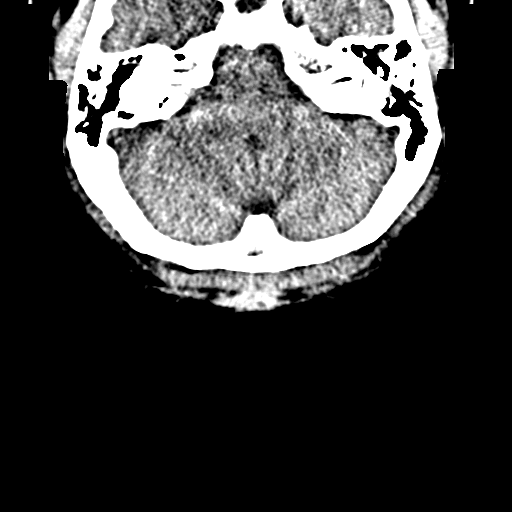

[Series 9: coronal bone · coronal · 0.19mm/px · 3 of 42 slices shown]
[im 14/42  brain]
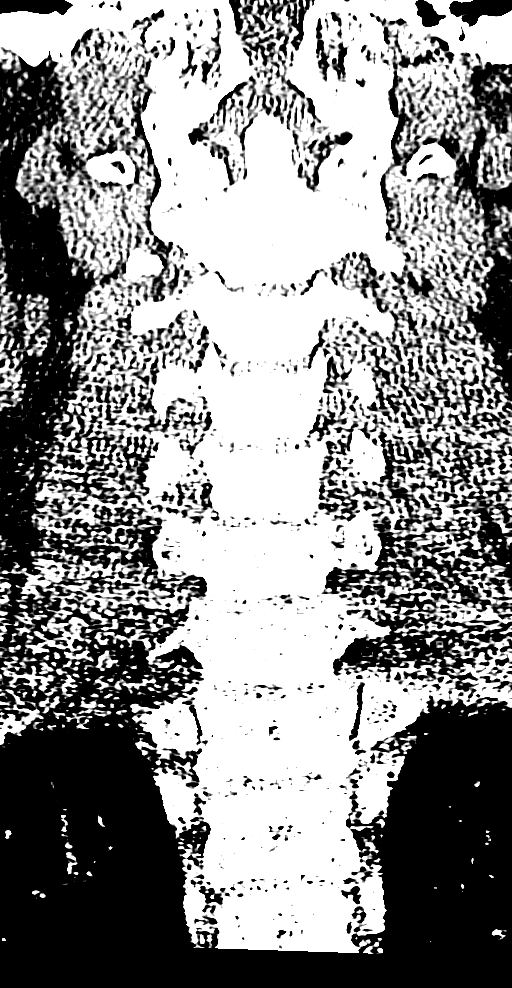
[im 19/42  brain]
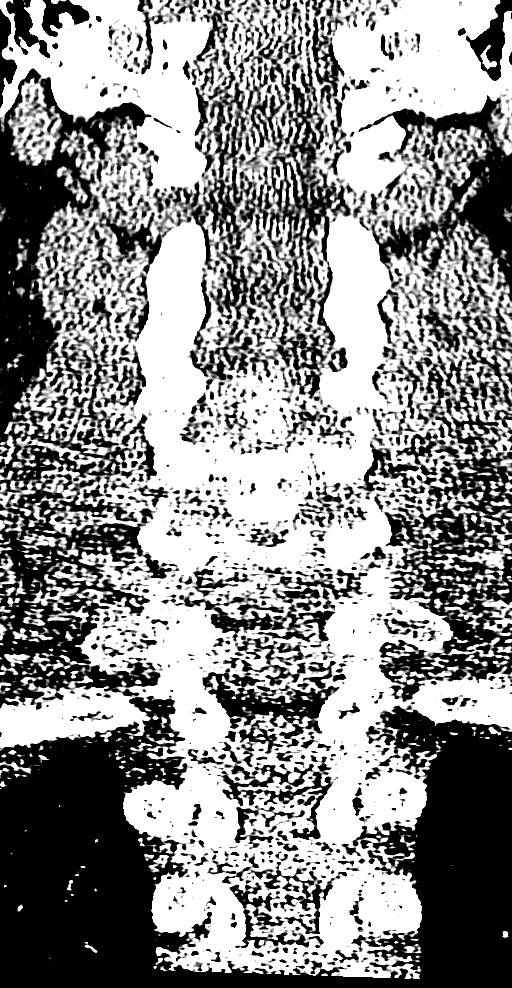
[im 23/42  brain]
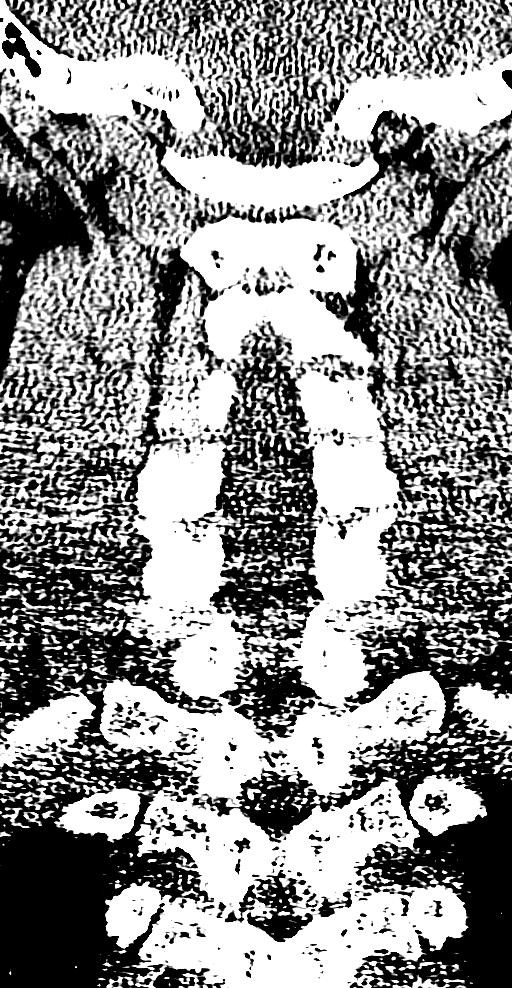

[14 of 47 positions shown; findings below may reference images not displayed]

FINDINGS: CT HEAD FINDINGS

Ventricle size is normal. Negative for acute or chronic infarction.
Negative for hemorrhage or fluid collection. Negative for mass or
edema. No shift of the midline structures.

Calvarium is intact.

CT CERVICAL SPINE FINDINGS

Normal alignment. Reversal of the cervical lordosis with kyphosis
likely due to patient positioning. Disc spaces well maintained. No
significant degenerative change.

Negative for cervical spine fracture.
IMPRESSION: Negative CT head and cervical spine.

## 2017-05-27 ENCOUNTER — Emergency Department
Admission: EM | Admit: 2017-05-27 | Discharge: 2017-05-27 | Disposition: A | Discharge status: Home / Self Care | Payer: BLUE CROSS/BLUE SHIELD | Attending: Emergency Medicine | Admitting: Emergency Medicine

## 2017-05-27 NOTE — ED Notes (Addendum)
Pt called in lobby 3 times during downtime, no response, LWBS before triage

## 2017-06-24 ENCOUNTER — Encounter (HOSPITAL_COMMUNITY): Payer: Self-pay

## 2017-06-24 ENCOUNTER — Emergency Department (HOSPITAL_COMMUNITY)
Admission: EM | Admit: 2017-06-24 | Discharge: 2017-06-24 | Disposition: A | Discharge status: Home / Self Care | Payer: BLUE CROSS/BLUE SHIELD | Attending: Emergency Medicine | Admitting: Emergency Medicine

## 2017-06-24 DIAGNOSIS — Z79899 Other long term (current) drug therapy: Secondary | ICD-10-CM | POA: Insufficient documentation

## 2017-06-24 DIAGNOSIS — G40909 Epilepsy, unspecified, not intractable, without status epilepticus: Secondary | ICD-10-CM | POA: Insufficient documentation

## 2017-06-24 DIAGNOSIS — R569 Unspecified convulsions: Secondary | ICD-10-CM

## 2017-06-24 NOTE — ED Triage Notes (Signed)
She was seen to have an absent seizure which progressed to a mild tonic-clonic seizure at work at which time she was also seen to "slide out of her chair". Her only c/o is of mild h/a. She tells us she has hx of seizures, and that her "seizure medicines were changed about two weeks ago".

## 2017-06-24 NOTE — ED Notes (Signed)
Bed: WA03 Expected date:  Expected time:  Means of arrival:  Comments: Hold for triage 1 

## 2017-06-24 NOTE — Discharge Instructions (Signed)
Call your neuro doc today and discuss your breakthrough seizures

## 2017-06-24 NOTE — ED Provider Notes (Addendum)
[facility]-EMERGENCY DEPT Provider Note   CSN: [id number] Arrival date & time: 06/24/17  1445     History   Chief Complaint Chief Complaint  Patient presents with  . Seizures    HPI Margaret Clark is a 26 y.o. female.  26 yo F with a chief complaint of seizure-lik Patient has a history of seizures. While at work she was found on the ground having a staring spell. They talked to her and she was able to track them with her eyes and then started shaking. She was able to hold onto her bosses hand while this was occurring. This resolved and then she spoke to her boss and then realized other people are watching went back into another seizure-like event. This happened at least one more time. Is expected to have lasted about 15 minutes in total. By the time EMS had arrived she was back to baseline. She was switched from Keppra to zonegram after her last EEG. She states that she feels like she is about to have a seizure all the time. Has been having about one a week. She has not talked to her neurologist because she doesn't have an appointment for 3 months. Has been compliant with her medications.   The history is provided by the patient.  Seizures   This is a new problem. The current episode started less than 1 hour ago. The problem has been resolved. There were 2 to 3 seizures. The most recent episode lasted more than 5 minutes. Associated symptoms include sleepiness. Pertinent negatives include no headaches, no visual disturbance, no chest pain, no nausea and no vomiting. Characteristics include eye blinking, rhythmic jerking and loss of consciousness. The episode was witnessed. There was the sensation of an aura present. The seizures did not continue in the ED. The seizure(s) had no focality. Possible causes include medication or dosage change (now on zonigren). There has been no fever.    Past Medical History:  Diagnosis Date  . Epilepsia (HCC)   . Seizures (HCC)     There are no active  problems to display for this patient.   History reviewed. No pertinent surgical history.  OB History    No data available       Home Medications    Prior to Admission medications   Medication Sig Start Date End Date Taking? Authorizing Provider  zonisamide (ZONEGRAN) 100 MG capsule Take 300 mg by mouth at bedtime.  06/15/17  Yes [provider]  cephALEXin (KEFLEX) 500 MG capsule Take 1 capsule (500 mg total) by mouth 3 (three) times daily. Patient not taking: Reported on 05/31/2016 05/17/16   [name], MD  diazepam (VALIUM) 2 MG tablet Take 1 tablet (2 mg total) by mouth every 8 (eight) hours as needed. Patient not taking: Reported on 06/24/2017 07/03/16   [name], [name], FNP  ferrous sulfate (EQL SLOW RELEASE IRON) 160 (50 Fe) MG TBCR SR tablet Take 1 tablet (160 mg total) by mouth daily. Patient not taking: Reported on 05/31/2016 01/22/16   [name], MD  ferrous sulfate 325 (65 FE) MG tablet Take 1 tablet (325 mg total) by mouth daily. Patient not taking: Reported on 05/31/2016 03/01/16 03/01/17  [name], PA-C  levETIRAcetam (KEPPRA) 750 MG tablet Take 1 tablet (750 mg total) by mouth 2 (two) times daily. Patient not taking: Reported on 06/24/2017 03/17/16   [name], MD  meloxicam (MOBIC) 15 MG tablet Take 1 tablet (15 mg total) by mouth daily. Patient  not taking: Reported on 05/31/2016 02/14/16   Beers, Charmayne Sheerharles M, PA-C  naproxen (NAPROSYN) 500 MG tablet Take 1 tablet (500 mg total) by mouth 2 (two) times daily with a meal. Patient not taking: Reported on 06/24/2017 07/03/16   Chinita Pesterriplett, Cari B, FNP    Family History No family history on file.  Social History Social History  Substance Use Topics  . Smoking status: Never Smoker  . Smokeless tobacco: Never Used  . Alcohol use Yes     Allergies   Shellfish allergy   Review of Systems Review of Systems  Constitutional: Negative for chills and fever.  HENT: Negative for congestion and rhinorrhea.    Eyes: Negative for redness and visual disturbance.  Respiratory: Negative for shortness of breath and wheezing.   Cardiovascular: Negative for chest pain and palpitations.  Gastrointestinal: Negative for nausea and vomiting.  Genitourinary: Negative for dysuria and urgency.  Musculoskeletal: Negative for arthralgias and myalgias.  Skin: Negative for pallor and wound.  Neurological: Positive for seizures and loss of consciousness. Negative for dizziness and headaches.     Physical Exam Updated Vital Signs LMP 06/02/2017 (Approximate)   Physical Exam  Constitutional: She is oriented to person, place, and time. She appears well-developed and well-nourished. No distress.  HENT:  Head: Normocephalic and atraumatic.  Eyes: Pupils are equal, round, and reactive to light. EOM are normal.  Neck: Normal range of motion. Neck supple.  Cardiovascular: Normal rate and regular rhythm.  Exam reveals no gallop and no friction rub.   No murmur heard. Pulmonary/Chest: Effort normal. She has no wheezes. She has no rales.  Abdominal: Soft. She exhibits no distension. There is no tenderness.  Musculoskeletal: She exhibits no edema or tenderness.  Neurological: She is alert and oriented to person, place, and time.  Skin: Skin is warm and dry. She is not diaphoretic.  Psychiatric: She has a normal mood and affect. Her behavior is normal.  Nursing note and vitals reviewed.    ED Treatments / Results  Labs (all labs ordered are listed, but only abnormal results are displayed) Labs Reviewed - No data to display  EKG  EKG Interpretation None       Radiology No results found.  Procedures Procedures (including critical care time)  Medications Ordered in ED Medications - No data to display   Initial Impression / Assessment and Plan / ED Course  I have reviewed the triage vital signs and the nursing notes.  Pertinent labs & imaging results that were available during my care of the  patient were reviewed by me and considered in my medical decision making (see chart for details).     26 yo F with a chief complaint of seizure-like activity. I talked with her boss at work who states that these typically happen when she is overwhelmed. She is stated to be compliant on her medications. I'll have her call her neurologist today.  3:59 PM:  I have discussed the diagnosis/risks/treatment options with the patient and believe the pt to be eligible for discharge home to follow-up with PCP. We also discussed returning to the ED immediately if new or worsening sx occur. We discussed the sx which are most concerning (e.g., sudden worsening pain, fever, inability to tolerate by mouth) that necessitate immediate return. Medications administered to the patient during their visit and any new prescriptions provided to the patient are listed below.  Medications given during this visit Medications - No data to display   The patient appears reasonably screen  and/or stabilized for discharge and I doubt any other medical condition or other [facility] requiring further screening, evaluation, or treatment in the ED at this time prior to discharge.    Final Clinical Impressions(s) / ED Diagnoses   Final diagnoses:  Seizure [facility])    New Prescriptions New Prescriptions   No medications on file     Melene Plan, DO 06/24/17 1558    Melene Plan, DO 06/24/17 1559

## 2017-07-30 ENCOUNTER — Emergency Department (HOSPITAL_COMMUNITY): Payer: BLUE CROSS/BLUE SHIELD

## 2017-07-30 ENCOUNTER — Encounter (HOSPITAL_COMMUNITY): Payer: Self-pay

## 2017-07-30 ENCOUNTER — Emergency Department (HOSPITAL_COMMUNITY)
Admission: EM | Admit: 2017-07-30 | Discharge: 2017-07-30 | Disposition: A | Discharge status: Home / Self Care | Payer: BLUE CROSS/BLUE SHIELD | Attending: Emergency Medicine | Admitting: Emergency Medicine

## 2017-07-30 DIAGNOSIS — J020 Streptococcal pharyngitis: Secondary | ICD-10-CM | POA: Diagnosis not present

## 2017-07-30 DIAGNOSIS — R079 Chest pain, unspecified: Secondary | ICD-10-CM | POA: Diagnosis present

## 2017-07-30 DIAGNOSIS — R112 Nausea with vomiting, unspecified: Secondary | ICD-10-CM | POA: Insufficient documentation

## 2017-07-30 DIAGNOSIS — R0789 Other chest pain: Secondary | ICD-10-CM | POA: Insufficient documentation

## 2017-07-30 DIAGNOSIS — Z79899 Other long term (current) drug therapy: Secondary | ICD-10-CM | POA: Diagnosis not present

## 2017-07-30 LAB — RAPID STREP SCREEN (MED CTR MEBANE ONLY): Streptococcus, Group A Screen (Direct): POSITIVE — AB

## 2017-07-30 LAB — CBG MONITORING, ED
GLUCOSE-CAPILLARY: 82 mg/dL (ref 65–99)
GLUCOSE-CAPILLARY: 77 mg/dL (ref 65–99)

## 2017-07-30 MED ORDER — SODIUM CHLORIDE 0.9 % IV BOLUS (SEPSIS)
500.0000 mL | Freq: Once | INTRAVENOUS | Status: AC
  Administered 2017-07-30: 500 mL via INTRAVENOUS

## 2017-07-30 MED ORDER — ONDANSETRON HCL 4 MG/2ML IJ SOLN
4.0000 mg | Freq: Once | INTRAMUSCULAR | Status: AC
  Administered 2017-07-30: 4 mg via INTRAVENOUS
  Filled 2017-07-30: qty 2

## 2017-07-30 MED ORDER — KETOROLAC TROMETHAMINE 30 MG/ML IJ SOLN
30.0000 mg | Freq: Once | INTRAMUSCULAR | Status: AC
  Administered 2017-07-30: 30 mg via INTRAVENOUS
  Filled 2017-07-30: qty 1

## 2017-07-30 MED ORDER — ONDANSETRON 4 MG PO TBDP: 4.0000 mg | ORAL_TABLET | Freq: Three times a day (TID) | ORAL | 0 refills | Status: DC | PRN

## 2017-07-30 MED ORDER — AMOXICILLIN 500 MG PO CAPS: 500.0000 mg | ORAL_CAPSULE | Freq: Two times a day (BID) | ORAL | 0 refills | Status: DC

## 2017-07-30 NOTE — ED Provider Notes (Signed)
[facility]-EMERGENCY DEPT Provider Note   CSN: [id number] Arrival date & time: 07/30/17  4098     History   Chief Complaint Chief Complaint  Patient presents with  . Seizures  . Sore Throat  . Nausea  . Chest Pain    HPI Margaret Clark is a 26 y.o. female.  The history is provided by the patient and medical records. No language interpreter was used.  Seizures   Associated symptoms include sore throat, chest pain, nausea, vomiting and diarrhea. Pertinent negatives include no headaches.  Sore Throat  Associated symptoms include chest pain. Pertinent negatives include no abdominal pain and no headaches.  Chest Pain   Associated symptoms include nausea and vomiting. Pertinent negatives include no abdominal pain, no dizziness, no headaches, no palpitations and no weakness.  Her past medical history is significant for seizures (3 days ago).   Margaret Clark is a 26 y.o. female  with a PMH of epilepsy who presents to the Emergency Department complaining of sore throat, chest pain, n/v/d x 3 days. Patient states that she has known seizure disorder on medication with compliance. She had a seizure three days ago and informed her neurologist. Neuro has called in another prescription to add to her current regimen. No seizure activity since this one 3 days ago. Since then, she has had sore throat, intermittent sharp central chest pain, nausea, loose non-bloody stools and three episodes of emesis. No congestion, fever, chills, back pain, urinary symptoms, vaginal discharge, shortness of breath. No known sick contacts. No alleviating or aggravating factors noted. No cardiac personal or family history. No HTN, HLD, DM. Not a smoker. Not on OCP's. No recent travel, surgeries or overnight hospital stays.   Past Medical History:  Diagnosis Date  . Epilepsia (HCC)   . Seizures (HCC)     There are no active problems to display for this patient.   History reviewed. No pertinent surgical  history.  OB History    No data available       Home Medications    Prior to Admission medications   Medication Sig Start Date End Date Taking? Authorizing Provider  OXcarbazepine (TRILEPTAL) 150 MG tablet Take 150-300 mg by mouth See admin instructions. Take 1 tablet ( ) by mouth daily for 7 days, then take 1 tablet ( ) by mouth twice daily   Yes [provider]  SUMAtriptan (IMITREX) 100 MG tablet Take 100 mg by mouth every 2 (two) hours as needed. 06/20/17  Yes [provider]  zonisamide (ZONEGRAN) 100 MG capsule Take 300 mg by mouth 2 (two) times daily.  06/15/17  Yes [provider]  amoxicillin (AMOXIL) 500 MG capsule Take 1 capsule (500 mg total) by mouth 2 (two) times daily. 07/30/17   [name], [name], PA-C  cephALEXin (KEFLEX) 500 MG capsule Take 1 capsule (500 mg total) by mouth 3 (three) times daily. Patient not taking: Reported on 05/31/2016 05/17/16   [name], MD  diazepam (VALIUM) 2 MG tablet Take 1 tablet (2 mg total) by mouth every 8 (eight) hours as needed. Patient not taking: Reported on 06/24/2017 07/03/16   [name], [name], FNP  ferrous sulfate (EQL SLOW RELEASE IRON) 160 (50 Fe) MG TBCR SR tablet Take 1 tablet (160 mg total) by mouth daily. Patient not taking: Reported on 05/31/2016 01/22/16   [name], MD  ferrous sulfate 325 (65 FE) MG tablet Take 1 tablet (325 mg total) by mouth daily. Patient not taking: Reported on 05/31/2016 03/01/16  03/01/17  [name], PA-C  levETIRAcetam (KEPPRA) 750 MG tablet Take 1 tablet (750 mg total) by mouth 2 (two) times daily. Patient not taking: Reported on 06/24/2017 03/17/16   [name], MD  meloxicam (MOBIC) 15 MG tablet Take 1 tablet (15 mg total) by mouth daily. Patient not taking: Reported on 05/31/2016 02/14/16   [name], PA-C  naproxen (NAPROSYN) 500 MG tablet Take 1 tablet (500 mg total) by mouth 2 (two) times daily with a meal. Patient not taking: Reported on  06/24/2017 07/03/16   [name], FNP  ondansetron (ZOFRAN ODT) 4 MG disintegrating tablet Take 1 tablet (4 mg total) by mouth every 8 (eight) hours as needed for nausea or vomiting. 07/30/17   [name], [name], PA-C    Family History No family history on file.  Social History Social History  Substance Use Topics  . Smoking status: Never Smoker  . Smokeless tobacco: Never Used  . Alcohol use Yes     Allergies   Shellfish allergy   Review of Systems Review of Systems  HENT: Positive for sore throat.   Cardiovascular: Positive for chest pain. Negative for palpitations and leg swelling.  Gastrointestinal: Positive for diarrhea, nausea and vomiting. Negative for abdominal pain and blood in stool.  Neurological: Positive for seizures (3 days ago). Negative for dizziness, weakness, light-headedness and headaches.  All other systems reviewed and are negative.    Physical Exam Updated Vital Signs BP 107/63 (BP Location: Left Arm)   Pulse 67   Temp 98.3 F (36.8 C) (Oral)   Resp 18   Ht  (1.676 m)   Wt 111.1 kg (245 lb)   LMP 07/09/2017   SpO2 100%   BMI 39.54 kg/m   Physical Exam  Constitutional: She is oriented to person, place, and time. She appears well-developed and well-nourished. No distress.  Nontoxic appearing.  HENT:  Head: Normocephalic and atraumatic.  Oropharynx with erythema, no exudates or tonsillar hypertrophy.  Cardiovascular: Normal rate, regular rhythm and normal heart sounds.   No murmur heard. Pulmonary/Chest: Effort normal and breath sounds normal. No respiratory distress. She has no wheezes. She has no rales.  Abdominal: Soft. She exhibits no distension.  No abdominal or CVA tenderness.  Musculoskeletal: She exhibits no edema.  Neurological: She is alert and oriented to person, place, and time.  Skin: Skin is warm and dry.  Nursing note and vitals reviewed.    ED Treatments / Results  Labs (all labs ordered are listed, but only  abnormal results are displayed) Labs Reviewed  RAPID STREP SCREEN (NOT AT [facility]) - Abnormal; Notable for the following:       Result Value   Streptococcus, Group A Screen (Direct) POSITIVE (*)    All other components within normal limits  CBG MONITORING, ED  POC URINE PREG, ED    EKG  EKG Interpretation None       Radiology Dg Chest 2 View  Result Date: 07/30/2017 CLINICAL DATA:  Sore throat and chest pain. EXAM: CHEST  2 VIEW COMPARISON:  Chest x-ray dated May 17, 2016. FINDINGS: The heart size and mediastinal contours are within normal limits. Both lungs are clear. The visualized skeletal structures are unremarkable. IMPRESSION: No active cardiopulmonary disease. Electronically Signed   By: Obie Dredge M.D.   On: 07/30/2017 13:09    Procedures Procedures (including critical care time)  Medications Ordered in ED Medications  ondansetron (ZOFRAN) injection 4 mg (4 mg Intravenous Given 07/30/17 1240)  ketorolac (TORADOL) 30 MG/ML injection 30 mg (30 mg Intravenous Given 07/30/17 1240)  sodium chloride 0.9 % bolus 500 mL (500 mLs Intravenous New Bag/Given 07/30/17 1241)     Initial Impression / Assessment and Plan / ED Course  I have reviewed the triage vital signs and the nursing notes.  Pertinent labs & imaging results that were available during my care of the patient were reviewed by me and considered in my medical decision making (see chart for details).    Margaret Clark is a 26 y.o. female who presents to ED for sore throat, nausea, vomiting x 3 days. Afebrile, well appearing with benign abdominal exam. OP with erythema. Rapid strep +.  Patient tolerating PO in ED. Feels improved after IVF, zofran, toradol. Will treat with amoxil. PCP follow up encouraged if symptoms persist. Reasons to return to ED discussed. All questions answered.    Final Clinical Impressions(s) / ED Diagnoses   Final diagnoses:    Strep pharyngitis    New Prescriptions New  Prescriptions   AMOXICILLIN (AMOXIL) 500 MG CAPSULE    Take 1 capsule (500 mg total) by mouth 2 (two) times daily.   ONDANSETRON (ZOFRAN ODT) 4 MG DISINTEGRATING TABLET    Take 1 tablet (4 mg total) by mouth every 8 (eight) hours as needed for nausea or vomiting.     [name], [name], PA-C 07/30/17 1331    Pricilla Loveless, MD 07/31/17 [phone]

## 2017-07-30 NOTE — ED Triage Notes (Signed)
Pt. Reports many symptoms today.  She had hx of seizures and had a seizure 3 days ago.    Pt. Reports that they are changing her medications.  She also reports having n/v/d. Sore throat and chest pain.  Skin is warm and dry.  Pt. Is alert and oriented

## 2017-07-30 NOTE — ED Notes (Signed)
Patient transported to X-ray 

## 2017-07-30 NOTE — Discharge Instructions (Signed)
You have strep throat or pharyngitis. Amoxicillin as prescribed twice daily for 10 full days. It is very important that you complete the entire course of this medication or the strep may not completely be treated.  Also discard your toothbrush and begin using a new one in 3 days. For sore throat, take ibuprofen every 6 hours as needed. Follow up with your doctor in 2-3 days if no improvement. Return to the ED sooner for worsening condition, inability to swallow, breathing difficulty, new concerns.

## 2018-01-28 ENCOUNTER — Encounter (HOSPITAL_COMMUNITY): Payer: Self-pay

## 2018-01-28 ENCOUNTER — Emergency Department (HOSPITAL_COMMUNITY): Payer: No Typology Code available for payment source

## 2018-01-28 ENCOUNTER — Emergency Department (HOSPITAL_COMMUNITY)
Admission: EM | Admit: 2018-01-28 | Discharge: 2018-01-28 | Disposition: A | Discharge status: Home / Self Care | Payer: No Typology Code available for payment source | Attending: Emergency Medicine | Admitting: Emergency Medicine

## 2018-01-28 ENCOUNTER — Other Ambulatory Visit: Payer: Self-pay

## 2018-01-28 DIAGNOSIS — J45909 Unspecified asthma, uncomplicated: Secondary | ICD-10-CM | POA: Diagnosis not present

## 2018-01-28 DIAGNOSIS — Z79899 Other long term (current) drug therapy: Secondary | ICD-10-CM | POA: Diagnosis not present

## 2018-01-28 DIAGNOSIS — S2232XD Fracture of one rib, left side, subsequent encounter for fracture with routine healing: Secondary | ICD-10-CM | POA: Insufficient documentation

## 2018-01-28 DIAGNOSIS — R0602 Shortness of breath: Secondary | ICD-10-CM | POA: Diagnosis present

## 2018-01-28 HISTORY — DX: Unspecified asthma, uncomplicated: J45.909

## 2018-01-28 LAB — I-STAT BETA HCG BLOOD, ED (MC, WL, AP ONLY): I-stat hCG, quantitative: <5 m[IU]/mL (ref <5)

## 2018-01-28 MED ORDER — IOPAMIDOL (ISOVUE-370) INJECTION 76%
100.0000 mL | Freq: Once | INTRAVENOUS | Status: AC | PRN
  Administered 2018-01-28: 100 mL via INTRAVENOUS

## 2018-01-28 MED ORDER — IOPAMIDOL (ISOVUE-370) INJECTION 76%
INTRAVENOUS | Status: AC
  Filled 2018-01-28: qty 100
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
## 2018-01-28 NOTE — ED Notes (Signed)
Patient transported to CT 
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
## 2018-01-28 NOTE — Discharge Instructions (Addendum)
Return if any problems.

## 2018-04-09 ENCOUNTER — Emergency Department (HOSPITAL_COMMUNITY)
Admission: EM | Admit: 2018-04-09 | Discharge: 2018-04-09 | Disposition: A | Discharge status: Home / Self Care | Payer: Medicaid Other | Attending: Emergency Medicine | Admitting: Emergency Medicine

## 2018-04-09 ENCOUNTER — Other Ambulatory Visit: Payer: Self-pay

## 2018-04-09 ENCOUNTER — Encounter (HOSPITAL_COMMUNITY): Payer: Self-pay | Admitting: Student

## 2018-04-09 DIAGNOSIS — R569 Unspecified convulsions: Secondary | ICD-10-CM

## 2018-04-09 DIAGNOSIS — Z79899 Other long term (current) drug therapy: Secondary | ICD-10-CM | POA: Insufficient documentation

## 2018-04-09 DIAGNOSIS — G40909 Epilepsy, unspecified, not intractable, without status epilepticus: Secondary | ICD-10-CM | POA: Insufficient documentation

## 2018-04-09 LAB — I-STAT CHEM 8, ED
BUN: 14 mg/dL (ref 6–20)
CHLORIDE: 106 mmol/L (ref 101–111)
CREATININE: 0.9 mg/dL (ref 0.44–1.00)
Calcium, Ion: 1.19 mmol/L (ref 1.15–1.40)
Glucose, Bld: 80 mg/dL (ref 65–99)
HEMATOCRIT: 29 % — AB (ref 36.0–46.0)
Hemoglobin: 9.9 g/dL — ABNORMAL LOW (ref 12.0–15.0)
Potassium: 4.7 mmol/L (ref 3.5–5.1)
SODIUM: 138 mmol/L (ref 135–145)
TCO2: 22 mmol/L (ref 22–32)

## 2018-04-09 LAB — CBC WITH DIFFERENTIAL/PLATELET
Basophils Absolute: 0 10*3/uL (ref 0.0–0.1)
Basophils Relative: 1 %
EOS PCT: 2 %
Eosinophils Absolute: 0.1 10*3/uL (ref 0.0–0.7)
HEMATOCRIT: 32.9 % — AB (ref 36.0–46.0)
Hemoglobin: 9.7 g/dL — ABNORMAL LOW (ref 12.0–15.0)
LYMPHS ABS: 2.5 10*3/uL (ref 0.7–4.0)
LYMPHS PCT: 40 %
MCH: 20.7 pg — AB (ref 26.0–34.0)
MCHC: 29.5 g/dL — ABNORMAL LOW (ref 30.0–36.0)
MCV: 70.1 fL — AB (ref 78.0–100.0)
MONO ABS: 0.4 10*3/uL (ref 0.1–1.0)
Monocytes Relative: 7 %
NEUTROS ABS: 3.3 10*3/uL (ref 1.7–7.7)
Neutrophils Relative %: 50 %
Platelets: 453 10*3/uL — ABNORMAL HIGH (ref 150–400)
RBC: 4.69 MIL/uL (ref 3.87–5.11)
RDW: 17.3 % — AB (ref 11.5–15.5)
WBC: 6.4 10*3/uL (ref 4.0–10.5)

## 2018-04-09 LAB — BASIC METABOLIC PANEL
ANION GAP: 7 (ref 5–15)
BUN: 15 mg/dL (ref 6–20)
CHLORIDE: 110 mmol/L (ref 101–111)
CO2: 21 mmol/L — ABNORMAL LOW (ref 22–32)
Calcium: 9.2 mg/dL (ref 8.9–10.3)
Creatinine, Ser: 0.85 mg/dL (ref 0.44–1.00)
GFR calc Af Amer: >60 mL/min (ref >60)
GFR calc non Af Amer: >60 mL/min (ref >60)
GLUCOSE: 81 mg/dL (ref 65–99)
Potassium: 5.6 mmol/L — ABNORMAL HIGH (ref 3.5–5.1)
Sodium: 138 mmol/L (ref 135–145)

## 2018-04-09 LAB — I-STAT BETA HCG BLOOD, ED (MC, WL, AP ONLY): I-stat hCG, quantitative: <5 m[IU]/mL (ref <5)

## 2018-04-09 LAB — CBG MONITORING, ED: Glucose-Capillary: 83 mg/dL (ref 65–99)
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
## 2018-04-09 NOTE — ED Triage Notes (Addendum)
Patient BIB EMS from work. Per report, patient was sitting on a chair when she started seizing x15 min. Per report, patient remained seated the entire time and never hit the floor. Patient has history of seizures. Patient was AxOx4 upon EMS arrival. Patient complaining of headache in triage. Patient reports she takes Zonegran for seizures (which is a newly prescribed medication for her.) Patient reports she last took her anti-seizure med last night, but forgot to take it this morning due to running late for work. EMS CBG= 98

## 2018-04-09 NOTE — ED Notes (Signed)
Bed: WA04 Expected date:  Expected time:  Means of arrival:  Comments: 27 yo seizures w/ hx of the same

## 2018-04-09 NOTE — Discharge Instructions (Signed)
You were seen in the emergency department today following a seizure.  Your lab work in the emergency department with similar to previous lab work you have had done.  Your hemoglobin was low consistent with previous anemia.  Initially your potassium was somewhat high, however we repeated this it seemed to be normal.  Please continue to take your medication strictly as prescribed.  Please call your neurology office today to inform them of today's seizure and discussed follow-up in the next 3 to 5 days.  Until cleared by neurology please do not drive a vehicle.  Please return to the ER for any new or worsening symptoms including but not limited to additional seizure activity, change in vision, numbness, weakness, loss of consciousness, fever, or any other concerns that you may have.

## 2018-05-05 ENCOUNTER — Emergency Department (HOSPITAL_COMMUNITY)
Admission: EM | Admit: 2018-05-05 | Discharge: 2018-05-05 | Discharge status: Absent without leave | Payer: No Typology Code available for payment source

## 2018-05-10 ENCOUNTER — Other Ambulatory Visit: Payer: Self-pay

## 2018-05-10 ENCOUNTER — Encounter (HOSPITAL_COMMUNITY): Payer: Self-pay

## 2018-05-10 ENCOUNTER — Emergency Department (HOSPITAL_COMMUNITY)
Admission: EM | Admit: 2018-05-10 | Discharge: 2018-05-10 | Disposition: A | Discharge status: Home / Self Care | Payer: Medicaid Other | Attending: Emergency Medicine | Admitting: Emergency Medicine

## 2018-05-10 DIAGNOSIS — G40909 Epilepsy, unspecified, not intractable, without status epilepticus: Secondary | ICD-10-CM | POA: Insufficient documentation

## 2018-05-10 DIAGNOSIS — X58XXXA Exposure to other specified factors, initial encounter: Secondary | ICD-10-CM | POA: Insufficient documentation

## 2018-05-10 DIAGNOSIS — Y929 Unspecified place or not applicable: Secondary | ICD-10-CM | POA: Insufficient documentation

## 2018-05-10 DIAGNOSIS — Z79899 Other long term (current) drug therapy: Secondary | ICD-10-CM | POA: Insufficient documentation

## 2018-05-10 DIAGNOSIS — J45909 Unspecified asthma, uncomplicated: Secondary | ICD-10-CM | POA: Insufficient documentation

## 2018-05-10 DIAGNOSIS — Y9389 Activity, other specified: Secondary | ICD-10-CM | POA: Insufficient documentation

## 2018-05-10 DIAGNOSIS — Y999 Unspecified external cause status: Secondary | ICD-10-CM | POA: Insufficient documentation

## 2018-05-10 DIAGNOSIS — S39012A Strain of muscle, fascia and tendon of lower back, initial encounter: Secondary | ICD-10-CM | POA: Insufficient documentation

## 2018-05-10 LAB — URINALYSIS, ROUTINE W REFLEX MICROSCOPIC
Bilirubin Urine: NEGATIVE
Glucose, UA: NEGATIVE mg/dL
Hgb urine dipstick: NEGATIVE
Ketones, ur: NEGATIVE mg/dL
Nitrite: NEGATIVE
PH: 6 (ref 5.0–8.0)
Protein, ur: NEGATIVE mg/dL
SPECIFIC GRAVITY, URINE: 1.021 (ref 1.005–1.030)

## 2018-05-10 LAB — PREGNANCY, URINE: PREG TEST UR: NEGATIVE

## 2018-05-10 MED ORDER — IBUPROFEN 200 MG PO TABS
400.0000 mg | ORAL_TABLET | Freq: Once | ORAL | Status: AC
  Administered 2018-05-10: 400 mg via ORAL
  Filled 2018-05-10: qty 2

## 2018-05-10 NOTE — Discharge Instructions (Addendum)
It was our pleasure to provide your ER care today - we hope that you feel better.  Your urine test look good, no sign of infection or kidney stone.   You may have lumbar/muscular strain causing your back pain. Avoid heavy lifting or bending at waist for the next week. You may try heat/heating pad, an over the counter heat/pain patch, gentle massage of sore area. Take motrin or aleve as need for pain.   Follow up with primary care doctor in the coming week.  For seizures, follow up with neurologist in the coming week - call office to arrange appointment. Discuss possible medical adjustment and/or eeg/video eeg testing.  No driving, swimming, operating heavy machinery, or other potentially dangerous activity until cleared to do so by your doctor.   Return to ER if worse, new symptoms, fevers, persistent seizures, other concern.

## 2018-05-10 NOTE — ED Notes (Signed)
Pt refuses basic labs, stating that she has been through this before, and doesn't want to be stuck unless she stays.

## 2018-05-10 NOTE — ED Provider Notes (Signed)
[facility]-EMERGENCY DEPT Provider Note   CSN: [id number] Arrival date & time: 05/10/18  0705     History   Chief Complaint Chief Complaint  Patient presents with  . Seizures    HPI Margaret Clark is a 27 y.o. female.  Patient c/o several seizures at home. Pt noted w hx seizures and ptsd. With seizures states in bed, denies fall or injury. No oral or tongue injury. No incontinence. Pt states had 4 or 5 seizures, but does not recall events. States compliant w home meds, no recent change. Normal appetite. No nvd. No dysuria, urgency or gu c/o. No fever or chills. States otherwise health at baseline. States increased seizures is a stressor for her, but otherwise denies new stressors/life events.   The history is provided by the patient.  Seizures   Pertinent negatives include no confusion, no headaches, no visual disturbance, no sore throat, no chest pain, no vomiting and no diarrhea.    Past Medical History:  Diagnosis Date  . Asthma   . Epilepsia (HCC)   . Seizures (HCC)     There are no active problems to display for this patient.   History reviewed. No pertinent surgical history.   OB History   None      Home Medications    Prior to Admission medications   Medication Sig Start Date End Date Taking? Authorizing Provider  albuterol (PROVENTIL HFA;VENTOLIN HFA) 108 (90 Base) MCG/ACT inhaler Inhale 2 puffs into the lungs every 6 (six) hours as needed for wheezing or shortness of breath.  09/30/17  Yes [provider]  hydrOXYzine (ATARAX/VISTARIL) 25 MG tablet TAKE 1 TABLET BY MOUTH THREE TIMES DAILY AS NEEDED FOR ANXIETY FOR UP TO 10 DAYS 04/05/18  Yes [provider]  ibuprofen (ADVIL,MOTRIN) 800 MG tablet TAKE 1 TABLET BY MOUTH EVERY 6 HOURS AS NEEDED FOR UP TO 10 DAYS 04/05/18  Yes [provider]  Oxcarbazepine (TRILEPTAL) 300 MG tablet TAKE 1 TABLET BY MOUTH TWICE DAILY FOLLOW INCREASE SCHEDULE AS GIVEN 04/05/18   Yes [provider]  zonisamide (ZONEGRAN) 100 MG capsule Take 100 mg by mouth 2 (two) times daily.  06/15/17  Yes [provider]    Family History No family history on file.  Social History Social History   Tobacco Use  . Smoking status: Never Smoker  . Smokeless tobacco: Never Used  Substance Use Topics  . Alcohol use: Yes  . Drug use: Yes    Frequency: 1.0 times per week    Types: Marijuana     Allergies   Shellfish allergy and Sulfa antibiotics   Review of Systems Review of Systems  Constitutional: Negative for fever.  HENT: Negative for sore throat and trouble swallowing.   Eyes: Negative for pain and visual disturbance.  Respiratory: Negative for shortness of breath.   Cardiovascular: Negative for chest pain.  Gastrointestinal: Negative for abdominal pain, diarrhea and vomiting.  Genitourinary: Negative for dysuria and flank pain.  Musculoskeletal: Negative for neck pain and neck stiffness.  Skin: Negative for rash and wound.  Neurological: Positive for seizures. Negative for weakness, light-headedness, numbness and headaches.  Hematological: Does not bruise/bleed easily.  Psychiatric/Behavioral: Negative for confusion.     Physical Exam Updated Vital Signs BP 105/62   Pulse 71   Temp 98.2 F (36.8 C) (Oral)   Resp 19   Ht 1.626 m (5\' 4" )   Wt 106.6 kg (235 lb)   LMP 04/18/2018   SpO2 100%  BMI 40.34 kg/m   Physical Exam  Constitutional: She is oriented to person, place, and time. She appears well-developed and well-nourished.  HENT:  Head: Atraumatic.  Mouth/Throat: Oropharynx is clear and moist.  No oral or tongue injury.   Eyes: Pupils are equal, round, and reactive to light. Conjunctivae are normal. No scleral icterus.  Neck: Normal range of motion. Neck supple. No tracheal deviation present.  No stiffness or rigidity  Cardiovascular: Normal rate, regular rhythm, normal heart sounds and intact distal pulses. Exam reveals no  gallop and no friction rub.  No murmur heard. Pulmonary/Chest: Effort normal and breath sounds normal. No respiratory distress.  Abdominal: Soft. Normal appearance and bowel sounds are normal. She exhibits no distension. There is no tenderness.  Genitourinary:  Genitourinary Comments: No cva tenderness  Musculoskeletal: She exhibits no edema.  CTLS spine, non tender, aligned, no step off. No focal bony tenderness on extremity exam.   Neurological: She is alert and oriented to person, place, and time.  Speech clear/fluent. Motor/sens grossly intact bil. Steady gait.   Skin: Skin is warm and dry. No rash noted. She is not diaphoretic.  Psychiatric: She has a normal mood and affect.  Nursing note and vitals reviewed.    ED Treatments / Results  Labs (all labs ordered are listed, but only abnormal results are displayed) Results for orders placed or performed during the hospital encounter of 05/10/18  Urinalysis, Routine w reflex microscopic  Result Value Ref Range   Color, Urine YELLOW YELLOW   APPearance HAZY (A) CLEAR   Specific Gravity, Urine 1.021 1.005 - 1.030   pH 6.0 5.0 - 8.0   Glucose, UA NEGATIVE NEGATIVE mg/dL   Hgb urine dipstick NEGATIVE NEGATIVE   Bilirubin Urine NEGATIVE NEGATIVE   Ketones, ur NEGATIVE NEGATIVE mg/dL   Protein, ur NEGATIVE NEGATIVE mg/dL   Nitrite NEGATIVE NEGATIVE   Leukocytes, UA TRACE (A) NEGATIVE   RBC / HPF 0-5 0 - 5 RBC/hpf   WBC, UA 0-5 0 - 5 WBC/hpf   Bacteria, UA FEW (A) NONE SEEN   Squamous Epithelial / LPF 11-20 0 - 5   Mucus PRESENT   Pregnancy, urine  Result Value Ref Range   Preg Test, Ur NEGATIVE NEGATIVE    EKG None  Radiology No results found.  Procedures Procedures (including critical care time)  Medications Ordered in ED Medications - No data to display   Initial Impression / Assessment and Plan / ED Course  I have reviewed the triage vital signs and the nursing notes.  Pertinent labs & imaging results that  were available during my care of the patient were reviewed by me and considered in my medical decision making (see chart for details).  Labs sent.  Reviewed nursing notes and prior charts for additional history.  In prior notes do make note of seizure hx, and also significant issues with anxiety and/or ptsd.   Pt's description of seizures seems unusual - reports many seizures in past few weeks, but no oral/mouth/tongue injury, no contusion or other apparent injury with the episodes. No postictal period/confusion described.   No seizure activity observed while in ED. Po fluids.  Pt does c/o right sided low back strain with a prior seizure - imaging study reviewed in prior charts - neg for bony injury or fx. Spine non tender. ?muscle strain. Motrin po.   Labs reviewed - ua neg, no rbc or wbc. u preg neg.  During 3 hour period observation in ED - no seizures,  mental status clear, afebrile.   Pt refuses blood work.   Will refer to neurology f/u - discuss outpatient med management program.   Return precautions provided.         Final Clinical Impressions(s) / ED Diagnoses   Final diagnoses:  None    ED Discharge Orders    None       Cathren Laine, MD 05/10/18 1024

## 2018-05-10 NOTE — ED Notes (Signed)
Bed: WA15 Expected date:  Expected time:  Means of arrival:  Comments: EMS- seizure 

## 2018-05-10 NOTE — ED Triage Notes (Signed)
Pt BIBA for seizures. Per friend, pt had 4 seizures this AM, lasting approx 1-2 minutes each. Per pt, she has been taking her seizure meds as directed. FSBG 111/73 BP 111/73 HR90NSR 99% RA

## 2018-08-16 ENCOUNTER — Encounter (HOSPITAL_COMMUNITY): Payer: Self-pay

## 2018-08-16 ENCOUNTER — Emergency Department (HOSPITAL_COMMUNITY): Payer: Self-pay

## 2018-08-16 ENCOUNTER — Other Ambulatory Visit: Payer: Self-pay

## 2018-08-16 ENCOUNTER — Emergency Department (HOSPITAL_COMMUNITY)
Admission: EM | Admit: 2018-08-16 | Discharge: 2018-08-16 | Disposition: A | Discharge status: Home / Self Care | Payer: Self-pay | Attending: Emergency Medicine | Admitting: Emergency Medicine

## 2018-08-16 DIAGNOSIS — R569 Unspecified convulsions: Secondary | ICD-10-CM

## 2018-08-16 DIAGNOSIS — D649 Anemia, unspecified: Secondary | ICD-10-CM | POA: Insufficient documentation

## 2018-08-16 DIAGNOSIS — Z79899 Other long term (current) drug therapy: Secondary | ICD-10-CM | POA: Insufficient documentation

## 2018-08-16 DIAGNOSIS — G40909 Epilepsy, unspecified, not intractable, without status epilepticus: Secondary | ICD-10-CM | POA: Insufficient documentation

## 2018-08-16 DIAGNOSIS — J45909 Unspecified asthma, uncomplicated: Secondary | ICD-10-CM | POA: Insufficient documentation

## 2018-08-16 LAB — D-DIMER, QUANTITATIVE: D-Dimer, Quant: 0.4 ug/mL-FEU (ref 0.00–0.50)

## 2018-08-16 LAB — CBC WITH DIFFERENTIAL/PLATELET
Abs Immature Granulocytes: 0.01 10*3/uL (ref 0.00–0.07)
BASOS PCT: 1 %
Basophils Absolute: 0 10*3/uL (ref 0.0–0.1)
EOS ABS: 0.1 10*3/uL (ref 0.0–0.5)
EOS PCT: 2 %
HCT: 30.6 % — ABNORMAL LOW (ref 36.0–46.0)
Hemoglobin: 8.4 g/dL — ABNORMAL LOW (ref 12.0–15.0)
Immature Granulocytes: 0 %
Lymphocytes Relative: 43 %
Lymphs Abs: 2 10*3/uL (ref 0.7–4.0)
MCH: 19.4 pg — AB (ref 26.0–34.0)
MCHC: 27.5 g/dL — AB (ref 30.0–36.0)
MCV: 70.8 fL — AB (ref 80.0–100.0)
MONO ABS: 0.4 10*3/uL (ref 0.1–1.0)
Monocytes Relative: 8 %
Neutro Abs: 2.3 10*3/uL (ref 1.7–7.7)
Neutrophils Relative %: 46 %
PLATELETS: 498 10*3/uL — AB (ref 150–400)
RBC: 4.32 MIL/uL (ref 3.87–5.11)
RDW: 18.4 % — AB (ref 11.5–15.5)
WBC: 4.8 10*3/uL (ref 4.0–10.5)
nRBC: 0 % (ref 0.0–0.2)

## 2018-08-16 LAB — BASIC METABOLIC PANEL
ANION GAP: 6 (ref 5–15)
BUN: 11 mg/dL (ref 6–20)
CALCIUM: 8.3 mg/dL — AB (ref 8.9–10.3)
CO2: 25 mmol/L (ref 22–32)
CREATININE: 0.75 mg/dL (ref 0.44–1.00)
Chloride: 107 mmol/L (ref 98–111)
Glucose, Bld: 87 mg/dL (ref 70–99)
Potassium: 3.9 mmol/L (ref 3.5–5.1)
Sodium: 138 mmol/L (ref 135–145)

## 2018-08-16 LAB — I-STAT BETA HCG BLOOD, ED (MC, WL, AP ONLY)

## 2018-08-16 LAB — CBG MONITORING, ED
GLUCOSE-CAPILLARY: 82 mg/dL (ref 70–99)
GLUCOSE-CAPILLARY: 82 mg/dL (ref 70–99)

## 2018-08-16 MED ORDER — FERROUS SULFATE 325 (65 FE) MG PO TABS: 325.0000 mg | ORAL_TABLET | Freq: Every day | ORAL | 4 refills | Status: DC

## 2018-08-16 MED ORDER — ALBUTEROL SULFATE HFA 108 (90 BASE) MCG/ACT IN AERS
2.0000 | INHALATION_SPRAY | RESPIRATORY_TRACT | Status: DC | PRN
  Administered 2018-08-16: 2 via RESPIRATORY_TRACT
  Filled 2018-08-16: qty 6.7

## 2018-08-16 MED ORDER — ALBUTEROL SULFATE (2.5 MG/3ML) 0.083% IN NEBU
5.0000 mg | INHALATION_SOLUTION | Freq: Once | RESPIRATORY_TRACT | Status: AC
  Administered 2018-08-16: 5 mg via RESPIRATORY_TRACT
  Filled 2018-08-16: qty 6
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
## 2018-08-16 NOTE — ED Triage Notes (Signed)
Patient arrived via [facility] from work. Patient working in [employer]. 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 Patient was sitting in a chair while having a seizure. No fall. No injuries. Confused after and when ems arrived. Hx. Seizures. Medication changed. Put on gabapentin.  A/Ox4.  C/o of chest tightness that started yesterday. Patient states whenever she has chest tightness she has a seizure. Worse with inspiration. 12 lead clean. Pt. Had past cardiac work up and everything normal.

## 2018-08-16 NOTE — Discharge Instructions (Addendum)
Start taking the iron to help with your anemia.  Use your inhaler as needed.  Make sure you are taking your seizure medication daily as prescribed.  Avoid sleep deprivation, alcohol use or dehydration.

## 2018-08-16 NOTE — ED Notes (Signed)
Seizure pads on bed rails. Patient given blanket. Patient talking on phone. Patient requested 02 for comfort. Patient put on 1 liter.

## 2018-08-16 NOTE — ED Notes (Addendum)
HFA AND WORK NOTE GIVEN. RX TO PICK UP

## 2018-10-18 ENCOUNTER — Encounter (HOSPITAL_COMMUNITY): Payer: Self-pay | Admitting: *Deleted

## 2018-10-18 ENCOUNTER — Emergency Department (HOSPITAL_COMMUNITY)
Admission: EM | Admit: 2018-10-18 | Discharge: 2018-10-18 | Disposition: A | Discharge status: Home / Self Care | Payer: Medicaid Other | Attending: Emergency Medicine | Admitting: Emergency Medicine

## 2018-10-18 ENCOUNTER — Emergency Department (HOSPITAL_COMMUNITY): Payer: Medicaid Other

## 2018-10-18 DIAGNOSIS — Z79899 Other long term (current) drug therapy: Secondary | ICD-10-CM | POA: Insufficient documentation

## 2018-10-18 DIAGNOSIS — R0789 Other chest pain: Secondary | ICD-10-CM | POA: Insufficient documentation

## 2018-10-18 DIAGNOSIS — D509 Iron deficiency anemia, unspecified: Secondary | ICD-10-CM | POA: Insufficient documentation

## 2018-10-18 DIAGNOSIS — J45909 Unspecified asthma, uncomplicated: Secondary | ICD-10-CM | POA: Insufficient documentation

## 2018-10-18 DIAGNOSIS — N920 Excessive and frequent menstruation with regular cycle: Secondary | ICD-10-CM | POA: Insufficient documentation

## 2018-10-18 DIAGNOSIS — F1210 Cannabis abuse, uncomplicated: Secondary | ICD-10-CM | POA: Insufficient documentation

## 2018-10-18 LAB — CBC WITH DIFFERENTIAL/PLATELET
Abs Immature Granulocytes: 0.01 10*3/uL (ref 0.00–0.07)
Basophils Absolute: 0 10*3/uL (ref 0.0–0.1)
Basophils Relative: 1 %
EOS PCT: 1 %
Eosinophils Absolute: 0.1 10*3/uL (ref 0.0–0.5)
HCT: 34.3 % — ABNORMAL LOW (ref 36.0–46.0)
Hemoglobin: 9.4 g/dL — ABNORMAL LOW (ref 12.0–15.0)
Immature Granulocytes: 0 %
Lymphocytes Relative: 40 %
Lymphs Abs: 2 10*3/uL (ref 0.7–4.0)
MCH: 19.7 pg — ABNORMAL LOW (ref 26.0–34.0)
MCHC: 27.4 g/dL — ABNORMAL LOW (ref 30.0–36.0)
MCV: 71.9 fL — ABNORMAL LOW (ref 80.0–100.0)
Monocytes Absolute: 0.3 10*3/uL (ref 0.1–1.0)
Monocytes Relative: 7 %
Neutro Abs: 2.5 10*3/uL (ref 1.7–7.7)
Neutrophils Relative %: 51 %
Platelets: 461 10*3/uL — ABNORMAL HIGH (ref 150–400)
RBC: 4.77 MIL/uL (ref 3.87–5.11)
RDW: 18.8 % — ABNORMAL HIGH (ref 11.5–15.5)
WBC: 4.9 10*3/uL (ref 4.0–10.5)
nRBC: 0 % (ref 0.0–0.2)

## 2018-10-18 LAB — BASIC METABOLIC PANEL
Anion gap: 8 (ref 5–15)
BUN: 10 mg/dL (ref 6–20)
CO2: 25 mmol/L (ref 22–32)
CREATININE: 0.77 mg/dL (ref 0.44–1.00)
Calcium: 8.7 mg/dL — ABNORMAL LOW (ref 8.9–10.3)
Chloride: 106 mmol/L (ref 98–111)
GFR calc Af Amer: >60 mL/min (ref >60)
GFR calc non Af Amer: >60 mL/min (ref >60)
Glucose, Bld: 88 mg/dL (ref 70–99)
Potassium: 3.6 mmol/L (ref 3.5–5.1)
Sodium: 139 mmol/L (ref 135–145)

## 2018-10-18 MED ORDER — FERROUS SULFATE 325 (65 FE) MG PO TABS: 325.0000 mg | ORAL_TABLET | Freq: Every day | ORAL | 0 refills | Status: DC

## 2018-10-18 MED ORDER — ALBUTEROL SULFATE (2.5 MG/3ML) 0.083% IN NEBU
5.0000 mg | INHALATION_SOLUTION | Freq: Once | RESPIRATORY_TRACT | Status: AC
  Administered 2018-10-18: 5 mg via RESPIRATORY_TRACT
  Filled 2018-10-18: qty 6

## 2018-10-18 MED ORDER — ACETAMINOPHEN 325 MG PO TABS
650.0000 mg | ORAL_TABLET | Freq: Once | ORAL | Status: AC
  Administered 2018-10-18: 650 mg via ORAL
  Filled 2018-10-18: qty 2

## 2018-10-18 NOTE — ED Notes (Signed)
Attempted to draw bloodx2-will consult lab/phlebotomy

## 2018-10-18 NOTE — ED Provider Notes (Addendum)
[facility]-EMERGENCY DEPT Provider Note   CSN: [id number] Arrival date & time: 10/18/18  6962     History   Chief Complaint Chief Complaint  Patient presents with  . Chest Pain  . Shortness of Breath  . Seizures    HPI Margaret Clark is a 27 y.o. female.  HPI complains of left-sided chest pain intermittent for the past 1 to 2 weeks.  Pain is pleuritic in quality.  Associated symptoms include shortness of breath.  Pain is nonexertional.  She denies cough or fever.  She also reports that she had 4 seizures last night.  She is been getting seizures several times per week for the past 6 months.  She saw her neurologist approximately 2 weeks ago who made an adjustment to her antiepileptic medications.  No other associated symptoms.  She treated herself with ibuprofen for chest pain, without relief.  No fever no cough no other associated symptoms  Past Medical History:  Diagnosis Date  . Asthma   . Epilepsia (HCC)   . Seizures (HCC)     There are no active problems to display for this patient.   History reviewed. No pertinent surgical history.   OB History   No obstetric history on file.      Home Medications    Prior to Admission medications   Medication Sig Start Date End Date Taking? Authorizing Provider  albuterol (PROVENTIL HFA;VENTOLIN HFA) 108 (90 Base) MCG/ACT inhaler Inhale 2 puffs into the lungs every 6 (six) hours as needed for wheezing or shortness of breath.  09/30/17  Yes [provider]  gabapentin (NEURONTIN) 600 MG tablet Take 600 mg by mouth 3 (three) times daily.  07/09/18  Yes [provider]  hydrOXYzine (ATARAX/VISTARIL) 25 MG tablet Take 25 mg by mouth every 8 (eight) hours as needed for itching.  04/05/18  Yes [provider]  zonisamide (ZONEGRAN) 100 MG capsule Take 2 capsules by mouth at bedtime. 09/30/18  Yes [provider]  ferrous sulfate 325 (65 FE) MG tablet Take 1 tablet (325 mg  total) by mouth daily. Patient not taking: Reported on 10/18/2018 08/16/18   Gwyneth Sprout, MD    Family History No family history on file.  Social History Social History   Tobacco Use  . Smoking status: Never Smoker  . Smokeless tobacco: Never Used  Substance Use Topics  . Alcohol use: Yes  . Drug use: Yes    Frequency: 1.0 times per week    Types: Marijuana     Allergies   Shellfish allergy and Sulfa antibiotics   Review of Systems Review of Systems  Constitutional: Negative.   HENT: Negative.   Respiratory: Positive for shortness of breath.   Cardiovascular: Positive for chest pain.  Gastrointestinal: Negative.   Musculoskeletal: Negative.   Skin: Negative.   Neurological: Positive for seizures.  Psychiatric/Behavioral: Negative.   All other systems reviewed and are negative.    Physical Exam Updated Vital Signs BP 109/65   Pulse 87   Temp 98.7 F (37.1 C) (Oral)   Resp 16   LMP 10/15/2018   SpO2 99%   Physical Exam Vitals signs and nursing note reviewed.  Constitutional:      Appearance: She is well-developed.  HENT:     Head: Normocephalic and atraumatic.  Eyes:     Conjunctiva/sclera: Conjunctivae normal.     Pupils: Pupils are equal, round, and reactive to light.  Neck:     Musculoskeletal: Neck supple.  Thyroid: No thyromegaly.     Trachea: No tracheal deviation.  Cardiovascular:     Rate and Rhythm: Normal rate and regular rhythm.     Pulses: Normal pulses.     Heart sounds: Normal heart sounds. No murmur.     Comments: Chest is tender left side, reproducing pain exactly Pulmonary:     Effort: Pulmonary effort is normal.     Breath sounds: Normal breath sounds.  Abdominal:     General: Bowel sounds are normal. There is no distension.     Palpations: Abdomen is soft.     Tenderness: There is no abdominal tenderness.  Musculoskeletal: Normal range of motion.        General: No tenderness.  Skin:    General: Skin is warm and  dry.     Findings: No rash.  Neurological:     Mental Status: She is alert.     Coordination: Coordination normal.      ED Treatments / Results  Labs (all labs ordered are listed, but only abnormal results are displayed) Labs Reviewed - No data to display  EKG EKG Interpretation  Date/Time:  Monday October 18 2018 08:33:47 EST Ventricular Rate:  77 PR Interval:    QRS Duration: 77 QT Interval:  372 QTC Calculation: 421 R Axis:   21 Text Interpretation:  Sinus rhythm Borderline T abnormalities, inferior leads No significant change since last tracing Confirmed by Doug Sou [phone]) on 10/18/2018 8:40:05 AM   Radiology Dg Chest 2 View  Result Date: 10/18/2018 CLINICAL DATA:  One week history of chest pain and shortness of breath. Some relief after breathing treatment but symptoms subsequently returned. History of seizure activity with seizures last night. History of asthma EXAM: CHEST - 2 VIEW COMPARISON:  PA and lateral chest x-ray of August 16, 2018 FINDINGS: The lungs are adequately inflated. There is no focal infiltrate. The interstitial markings are coarse though stable. The heart and pulmonary vascularity are normal. The mediastinum is normal in width. The bony thorax exhibits no acute abnormality. IMPRESSION: Mild bronchitic-reactive airway changes. No discrete pneumonia nor other acute cardiopulmonary abnormality. Electronically Signed   By: David  [place] M.D.   On: 10/18/2018 08:56    Procedures Procedures (including critical care time)  Medications Ordered in ED Medications  albuterol (PROVENTIL) (2.5 MG/3ML) 0.083% nebulizer solution 5 mg (has no administration in time range)   Results for orders placed or performed during the hospital encounter of 10/18/18  CBC with Differential/Platelet  Result Value Ref Range   WBC 4.9 4.0 - 10.5 K/uL   RBC 4.77 3.87 - 5.11 MIL/uL   Hemoglobin 9.4 (L) 12.0 - 15.0 g/dL   HCT 60.4 (L) 54.0 - 98.1 %   MCV 71.9 (L) 80.0 -  100.0 fL   MCH 19.7 (L) 26.0 - 34.0 pg   MCHC 27.4 (L) 30.0 - 36.0 g/dL   RDW 19.1 (H) 47.8 - 29.5 %   Platelets 461 (H) 150 - 400 K/uL   nRBC 0.0 0.0 - 0.2 %   Neutrophils Relative % 51 %   Neutro Abs 2.5 1.7 - 7.7 K/uL   Lymphocytes Relative 40 %   Lymphs Abs 2.0 0.7 - 4.0 K/uL   Monocytes Relative 7 %   Monocytes Absolute 0.3 0.1 - 1.0 K/uL   Eosinophils Relative 1 %   Eosinophils Absolute 0.1 0.0 - 0.5 K/uL   Basophils Relative 1 %   Basophils Absolute 0.0 0.0 - 0.1 K/uL   Immature Granulocytes 0 %  Abs Immature Granulocytes 0.01 0.00 - 0.07 K/uL  Basic metabolic panel  Result Value Ref Range   Sodium 139 135 - 145 mmol/L   Potassium 3.6 3.5 - 5.1 mmol/L   Chloride 106 98 - 111 mmol/L   CO2 25 22 - 32 mmol/L   Glucose, Bld 88 70 - 99 mg/dL   BUN 10 6 - 20 mg/dL   Creatinine, Ser 3.08 0.44 - 1.00 mg/dL   Calcium 8.7 (L) 8.9 - 10.3 mg/dL   GFR calc non Af Amer >60 >60 mL/min   GFR calc Af Amer >60 >60 mL/min   Anion gap 8 5 - 15   Dg Chest 2 View  Result Date: 10/18/2018 CLINICAL DATA:  One week history of chest pain and shortness of breath. Some relief after breathing treatment but symptoms subsequently returned. History of seizure activity with seizures last night. History of asthma EXAM: CHEST - 2 VIEW COMPARISON:  PA and lateral chest x-ray of August 16, 2018 FINDINGS: The lungs are adequately inflated. There is no focal infiltrate. The interstitial markings are coarse though stable. The heart and pulmonary vascularity are normal. The mediastinum is normal in width. The bony thorax exhibits no acute abnormality. IMPRESSION: Mild bronchitic-reactive airway changes. No discrete pneumonia nor other acute cardiopulmonary abnormality. Electronically Signed   By: David  [place] M.D.   On: 10/18/2018 08:56   Results for orders placed or performed during the hospital encounter of 10/18/18  CBC with Differential/Platelet  Result Value Ref Range   WBC 4.9 4.0 - 10.5 K/uL   RBC  4.77 3.87 - 5.11 MIL/uL   Hemoglobin 9.4 (L) 12.0 - 15.0 g/dL   HCT 65.7 (L) 84.6 - 96.2 %   MCV 71.9 (L) 80.0 - 100.0 fL   MCH 19.7 (L) 26.0 - 34.0 pg   MCHC 27.4 (L) 30.0 - 36.0 g/dL   RDW 95.2 (H) 84.1 - 32.4 %   Platelets 461 (H) 150 - 400 K/uL   nRBC 0.0 0.0 - 0.2 %   Neutrophils Relative % 51 %   Neutro Abs 2.5 1.7 - 7.7 K/uL   Lymphocytes Relative 40 %   Lymphs Abs 2.0 0.7 - 4.0 K/uL   Monocytes Relative 7 %   Monocytes Absolute 0.3 0.1 - 1.0 K/uL   Eosinophils Relative 1 %   Eosinophils Absolute 0.1 0.0 - 0.5 K/uL   Basophils Relative 1 %   Basophils Absolute 0.0 0.0 - 0.1 K/uL   Immature Granulocytes 0 %   Abs Immature Granulocytes 0.01 0.00 - 0.07 K/uL  Basic metabolic panel  Result Value Ref Range   Sodium 139 135 - 145 mmol/L   Potassium 3.6 3.5 - 5.1 mmol/L   Chloride 106 98 - 111 mmol/L   CO2 25 22 - 32 mmol/L   Glucose, Bld 88 70 - 99 mg/dL   BUN 10 6 - 20 mg/dL   Creatinine, Ser 4.01 0.44 - 1.00 mg/dL   Calcium 8.7 (L) 8.9 - 10.3 mg/dL   GFR calc non Af Amer >60 >60 mL/min   GFR calc Af Amer >60 >60 mL/min   Anion gap 8 5 - 15   Dg Chest 2 View  Result Date: 10/18/2018 CLINICAL DATA:  One week history of chest pain and shortness of breath. Some relief after breathing treatment but symptoms subsequently returned. History of seizure activity with seizures last night. History of asthma EXAM: CHEST - 2 VIEW COMPARISON:  PA and lateral chest x-ray of August 16, 2018  FINDINGS: The lungs are adequately inflated. There is no focal infiltrate. The interstitial markings are coarse though stable. The heart and pulmonary vascularity are normal. The mediastinum is normal in width. The bony thorax exhibits no acute abnormality. IMPRESSION: Mild bronchitic-reactive airway changes. No discrete pneumonia nor other acute cardiopulmonary abnormality. Electronically Signed   By: David  [place] M.D.   On: 10/18/2018 08:56   Initial Impression / Assessment and Plan / ED Course  I  have reviewed the triage vital signs and the nursing notes.  Pertinent labs & imaging results that were available during my care of the patient were reviewed by me and considered in my medical decision making (see chart for details).    11:15 AM breathing improved after treatment with albuterol nebulized treatment   Old records reviewed.  Patient had similar presentation 08/16/2018.  Felt better after nebulized treatment.  Noted to be anemic.  Had negative d-dimer at that visit. No suspicion for pulmonary embolism.  Highly atypical intermittent symptoms.  Negative d-dimer on primary visit Chest x-ray viewed by me Exam is consistent with chest wall pain.  Labwork consistent with anemia, improved over 2 months ago.   Patient encouraged to stop smoking marijuana.  She is to follow-up with neurologist regarding seizures  Prescription iron sulfate.  Follow-up with PMD for possible referral to gynecologist regarding heavy menses.  Tylenol for pain.  Albuterol 2 puffs every 4 hours as needed for shortness of breath Results for orders placed or performed during the hospital encounter of 10/18/18  CBC with Differential/Platelet  Result Value Ref Range   WBC 4.9 4.0 - 10.5 K/uL   RBC 4.77 3.87 - 5.11 MIL/uL   Hemoglobin 9.4 (L) 12.0 - 15.0 g/dL   HCT 16.134.3 (L) 09.636.0 - 04.546.0 %   MCV 71.9 (L) 80.0 - 100.0 fL   MCH 19.7 (L) 26.0 - 34.0 pg   MCHC 27.4 (L) 30.0 - 36.0 g/dL   RDW 40.918.8 (H) 81.111.5 - 91.415.5 %   Platelets 461 (H) 150 - 400 K/uL   nRBC 0.0 0.0 - 0.2 %   Neutrophils Relative % 51 %   Neutro Abs 2.5 1.7 - 7.7 K/uL   Lymphocytes Relative 40 %   Lymphs Abs 2.0 0.7 - 4.0 K/uL   Monocytes Relative 7 %   Monocytes Absolute 0.3 0.1 - 1.0 K/uL   Eosinophils Relative 1 %   Eosinophils Absolute 0.1 0.0 - 0.5 K/uL   Basophils Relative 1 %   Basophils Absolute 0.0 0.0 - 0.1 K/uL   Immature Granulocytes 0 %   Abs Immature Granulocytes 0.01 0.00 - 0.07 K/uL  Basic metabolic panel  Result Value  Ref Range   Sodium 139 135 - 145 mmol/L   Potassium 3.6 3.5 - 5.1 mmol/L   Chloride 106 98 - 111 mmol/L   CO2 25 22 - 32 mmol/L   Glucose, Bld 88 70 - 99 mg/dL   BUN 10 6 - 20 mg/dL   Creatinine, Ser 7.820.77 0.44 - 1.00 mg/dL   Calcium 8.7 (L) 8.9 - 10.3 mg/dL   GFR calc non Af Amer >60 >60 mL/min   GFR calc Af Amer >60 >60 mL/min   Anion gap 8 5 - 15   Dg Chest 2 View  Result Date: 10/18/2018 CLINICAL DATA:  One week history of chest pain and shortness of breath. Some relief after breathing treatment but symptoms subsequently returned. History of seizure activity with seizures last night. History of asthma EXAM: CHEST - 2  VIEW COMPARISON:  PA and lateral chest x-ray of August 16, 2018 FINDINGS: The lungs are adequately inflated. There is no focal infiltrate. The interstitial markings are coarse though stable. The heart and pulmonary vascularity are normal. The mediastinum is normal in width. The bony thorax exhibits no acute abnormality. IMPRESSION: Mild bronchitic-reactive airway changes. No discrete pneumonia nor other acute cardiopulmonary abnormality. Electronically Signed   By: David  [place] M.D.   On: 10/18/2018 08:56   Final Clinical Impressions(s) / ED Diagnoses   #1 chest wall pain Final diagnoses:  None   #2 microcytic anemia #3 marijuana abuse #4 menorrhagia ED Discharge Orders    None       Doug SouJacubowitz, Hayle Parisi, MD 10/18/18 1129    Doug SouJacubowitz, Jalyne Brodzinski, MD 10/18/18 [phone]

## 2018-10-18 NOTE — ED Triage Notes (Signed)
Pt complains of chest pain and shortness of breath x 1 week. Pt states she received breathing treatment and felt better but chest pain returned. Pt states she had multiple seizures last night. Pt states she has been taking her seizure medication.

## 2018-10-18 NOTE — Discharge Instructions (Addendum)
Take Tylenol every 650 mg 4 hours as needed for pain.  Take the iron supplement prescribed which will help with anemia.  Call your primary care physician to schedule an office visit.  You may need referral to a gynecologist to help with heavy menstrual periods.  You can use your albuterol inhaler 2 puffs every 4 hours as needed for shortness of breath. Stop smoking marijuana as it can cause lung damage

## 2018-10-18 NOTE — ED Notes (Signed)
RT called for treatment

## 2018-11-10 ENCOUNTER — Ambulatory Visit: Payer: Self-pay | Admitting: Nurse Practitioner

## 2018-11-19 ENCOUNTER — Emergency Department (HOSPITAL_BASED_OUTPATIENT_CLINIC_OR_DEPARTMENT_OTHER): Payer: Managed Care, Other (non HMO)

## 2018-11-19 ENCOUNTER — Other Ambulatory Visit: Payer: Self-pay

## 2018-11-19 ENCOUNTER — Encounter (HOSPITAL_BASED_OUTPATIENT_CLINIC_OR_DEPARTMENT_OTHER): Payer: Self-pay

## 2018-11-19 ENCOUNTER — Emergency Department (HOSPITAL_BASED_OUTPATIENT_CLINIC_OR_DEPARTMENT_OTHER)
Admission: EM | Admit: 2018-11-19 | Discharge: 2018-11-19 | Disposition: A | Discharge status: Home / Self Care | Payer: Managed Care, Other (non HMO) | Attending: Emergency Medicine | Admitting: Emergency Medicine

## 2018-11-19 DIAGNOSIS — W19XXXA Unspecified fall, initial encounter: Secondary | ICD-10-CM | POA: Diagnosis not present

## 2018-11-19 DIAGNOSIS — S6991XA Unspecified injury of right wrist, hand and finger(s), initial encounter: Secondary | ICD-10-CM | POA: Diagnosis present

## 2018-11-19 DIAGNOSIS — J45909 Unspecified asthma, uncomplicated: Secondary | ICD-10-CM | POA: Insufficient documentation

## 2018-11-19 DIAGNOSIS — S63601A Unspecified sprain of right thumb, initial encounter: Secondary | ICD-10-CM | POA: Insufficient documentation

## 2018-11-19 DIAGNOSIS — Z79899 Other long term (current) drug therapy: Secondary | ICD-10-CM | POA: Diagnosis not present

## 2018-11-19 DIAGNOSIS — Y9389 Activity, other specified: Secondary | ICD-10-CM | POA: Diagnosis not present

## 2018-11-19 DIAGNOSIS — Y999 Unspecified external cause status: Secondary | ICD-10-CM | POA: Diagnosis not present

## 2018-11-19 DIAGNOSIS — R569 Unspecified convulsions: Secondary | ICD-10-CM | POA: Insufficient documentation

## 2018-11-19 DIAGNOSIS — Y929 Unspecified place or not applicable: Secondary | ICD-10-CM | POA: Insufficient documentation

## 2018-11-19 MED ORDER — IBUPROFEN 600 MG PO TABS: 600.0000 mg | ORAL_TABLET | Freq: Three times a day (TID) | ORAL | 0 refills | Status: DC | PRN

## 2018-11-19 NOTE — Discharge Instructions (Addendum)
For your thumb:  Do not lift ANYTHING more than 5 lb with your hand/thumb Wear the brace AT ALL TIMES when able; it is OKAY to take off for showering/bathing Follow-up with a primary doctor in 1 week; alternatively, you can call the number above to see a Sports Medicine specialist Take Tylenol and the prescribed Ibuprofen for pain

## 2018-11-19 NOTE — ED Triage Notes (Addendum)
Pt c/o right hand injury after seizure last night-NAD-steady gait-pt states she is compliant with meds-~2 seizures/week and MD is adjusting meds

## 2018-11-19 NOTE — ED Provider Notes (Addendum)
[facility] EMERGENCY DEPARTMENT Provider Note   CSN: [id number] Arrival date & time: 11/19/18  1220     History   Chief Complaint Chief Complaint  Patient presents with  . Hand Injury    HPI Margaret Clark is a 28 y.o. female.  HPI 28 year old female with history of epilepsy and recurrent seizures here with hand pain.  The patient states that last night, she had a seizure.  She reports that she believes she fell forward onto her right hand.  She believes her thumb and wrist were flexed, pulling her thumb backwards.  She reports that when she awoke, she had severe, throbbing, aching, right thumb pain.  This is worse on the dorsum of the thumb and worse with extension or flexion.  She has since had worsening swelling and increasing pain so she presents for evaluation.  She is right-hand dominant.  Denies any redness.  No open wounds.  No wrist pain.  Denies any headache.  Regarding her seizures, this is a known, chronic issue and she is currently in discussion with her neurologist in titrating her medications.  She declines any new concerns from a seizure perspective.  Denies any distal numbness or weakness. Past Medical History:  Diagnosis Date  . Asthma   . Epilepsia (HCC)   . Seizures (HCC)     There are no active problems to display for this patient.   History reviewed. No pertinent surgical history.   OB History   No obstetric history on file.      Home Medications    Prior to Admission medications   Medication Sig Start Date End Date Taking? Authorizing Provider  albuterol (PROVENTIL HFA;VENTOLIN HFA) 108 (90 Base) MCG/ACT inhaler Inhale 2 puffs into the lungs every 6 (six) hours as needed for wheezing or shortness of breath.  09/30/17   [provider]  ferrous sulfate 325 (65 FE) MG tablet Take 1 tablet (325 mg total) by mouth daily. 10/18/18   Doug Sou, MD  gabapentin (NEURONTIN) 600 MG tablet Take 600 mg by mouth 3 (three) times  daily.  07/09/18   [provider]  hydrOXYzine (ATARAX/VISTARIL) 25 MG tablet Take 25 mg by mouth every 8 (eight) hours as needed for itching.  04/05/18   [provider]  ibuprofen (ADVIL,MOTRIN) 600 MG tablet Take 1 tablet (600 mg total) by mouth every 8 (eight) hours as needed for moderate pain. 11/19/18   Shaune Pollack, MD  zonisamide (ZONEGRAN) 100 MG capsule Take 2 capsules by mouth at bedtime. 09/30/18   [provider]    Family History No family history on file.  Social History Social History   Tobacco Use  . Smoking status: Never Smoker  . Smokeless tobacco: Never Used  Substance Use Topics  . Alcohol use: Not Currently  . Drug use: Yes    Types: Marijuana     Allergies   Shellfish allergy and Sulfa antibiotics   Review of Systems Review of Systems  Constitutional: Negative for chills and fever.  Respiratory: Negative for shortness of breath.   Cardiovascular: Negative for chest pain.  Musculoskeletal: Positive for arthralgias. Negative for neck pain.  Skin: Negative for rash and wound.  Allergic/Immunologic: Negative for immunocompromised state.  Neurological: Negative for weakness and numbness.  Hematological: Does not bruise/bleed easily.     Physical Exam Updated Vital Signs BP 118/77 (BP Location: Left Arm)   Pulse 88   Temp 98.7 F (37.1 C) (Oral)   Resp 18  Ht 5\' 5"  (1.651 m)   Wt 109.3 kg   LMP 10/29/2018 (Approximate)   SpO2 100%   BMI 40.10 kg/m   Physical Exam Vitals signs and nursing note reviewed.  Constitutional:      General: She is not in acute distress.    Appearance: She is well-developed.  HENT:     Head: Normocephalic and atraumatic.  Eyes:     Conjunctiva/sclera: Conjunctivae normal.  Neck:     Musculoskeletal: Neck supple.  Cardiovascular:     Rate and Rhythm: Normal rate and regular rhythm.     Heart sounds: Normal heart sounds.  Pulmonary:     Effort: Pulmonary effort is normal. No  respiratory distress.     Breath sounds: No wheezing.  Abdominal:     General: There is no distension.  Skin:    General: Skin is warm.     Capillary Refill: Capillary refill takes less than 2 seconds.     Findings: No rash.  Neurological:     Mental Status: She is alert and oriented to person, place, and time.     Motor: No abnormal muscle tone.     LOWER EXTREMITY EXAM: RIGHT  INSPECTION & PALPATION: Moderate TTP over dorsal aspect of thumb. Thumb flexion, extension, abduction, adduction, and opposition is intact but diffusely painful. Mild TTP along radial aspect with ulnar deviation at wrist. No open wounds. No redness.  SENSORY: sensation is intact to light touch in:  Superficial peroneal nerve distribution (over dorsum of foot) Deep peroneal nerve distribution (over first dorsal web space) Sural nerve distribution (over lateral aspect 5th metatarsal) Saphenous nerve distribution (over medial instep)  MOTOR:  + Motor EHL (great toe dorsiflexion) + FHL (great toe plantar flexion)  + TA (ankle dorsiflexion)  + GSC (ankle plantar flexion)  VASCULAR: 2+ dorsalis pedis and posterior tibialis pulses Capillary refill < 2 sec, toes warm and well-perfused  COMPARTMENTS: Soft, warm, well-perfused No pain with passive extension No parethesias    ED Treatments / Results  Labs (all labs ordered are listed, but only abnormal results are displayed) Labs Reviewed - No data to display  EKG None  Radiology Dg Hand Complete Right  Result Date: 11/19/2018 CLINICAL DATA:  28 year old female with a history of seizure and hand pain EXAM: RIGHT HAND - COMPLETE 3+ VIEW COMPARISON:  10/20/2014 FINDINGS: There is no evidence of fracture or dislocation. There is no evidence of arthropathy or other focal bone abnormality. Soft tissues are unremarkable. IMPRESSION: Negative. Electronically Signed   By: Gilmer MorJaime  Wagner D.O.   On: 11/19/2018 12:54    Procedures Procedures (including  critical care time)  Medications Ordered in ED Medications - No data to display   Initial Impression / Assessment and Plan / ED Course  I have reviewed the triage vital signs and the nursing notes.  Pertinent labs & imaging results that were available during my care of the patient were reviewed by me and considered in my medical decision making (see chart for details).     28 year old female here with suspected right thumb sprain following hyperextension injury.  Patient does have some tenderness along her radial collateral ligament, and historically, concern for possible ligamentous injury.  She is neurovascularly intact.  No bony tenderness.  Plain films negative.  Will place her in a spica splint and have her follow-up with her PCP.  If symptoms persist, will need hand referral.  I have also provided her with a orthopedist number.  Return precautions given.  Final Clinical Impressions(s) / ED Diagnoses   Final diagnoses:  Sprain of right thumb, unspecified site of finger, initial encounter    ED Discharge Orders         Ordered    ibuprofen (ADVIL,MOTRIN) 600 MG tablet  Every 8 hours PRN     11/19/18 1344           Shaune PollackIsaacs, Fatim Vanderschaaf, MD 11/19/18 1507    Shaune PollackIsaacs, Adreana Coull, MD 11/19/18 [phone]

## 2019-05-30 ENCOUNTER — Other Ambulatory Visit: Payer: Self-pay

## 2019-05-30 ENCOUNTER — Emergency Department (HOSPITAL_COMMUNITY): Payer: 59

## 2019-05-30 ENCOUNTER — Emergency Department (HOSPITAL_COMMUNITY)
Admission: EM | Admit: 2019-05-30 | Discharge: 2019-05-30 | Disposition: A | Discharge status: Home / Self Care | Payer: 59 | Attending: Emergency Medicine | Admitting: Emergency Medicine

## 2019-05-30 ENCOUNTER — Encounter (HOSPITAL_COMMUNITY): Payer: Self-pay | Admitting: Emergency Medicine

## 2019-05-30 DIAGNOSIS — J45909 Unspecified asthma, uncomplicated: Secondary | ICD-10-CM | POA: Insufficient documentation

## 2019-05-30 DIAGNOSIS — R569 Unspecified convulsions: Secondary | ICD-10-CM

## 2019-05-30 DIAGNOSIS — G40909 Epilepsy, unspecified, not intractable, without status epilepticus: Secondary | ICD-10-CM | POA: Diagnosis present

## 2019-05-30 DIAGNOSIS — Z79899 Other long term (current) drug therapy: Secondary | ICD-10-CM | POA: Diagnosis not present

## 2019-05-30 LAB — URINALYSIS, ROUTINE W REFLEX MICROSCOPIC
Bacteria, UA: NONE SEEN
Bilirubin Urine: NEGATIVE
Glucose, UA: NEGATIVE mg/dL
Ketones, ur: NEGATIVE mg/dL
Leukocytes,Ua: NEGATIVE
Nitrite: NEGATIVE
Protein, ur: NEGATIVE mg/dL
Specific Gravity, Urine: 1.026 (ref 1.005–1.030)
pH: 5 (ref 5.0–8.0)

## 2019-05-30 LAB — COMPREHENSIVE METABOLIC PANEL
ALT: 19 U/L (ref 0–44)
AST: 20 U/L (ref 15–41)
Albumin: 3.9 g/dL (ref 3.5–5.0)
Alkaline Phosphatase: 41 U/L (ref 38–126)
Anion gap: 9 (ref 5–15)
BUN: 11 mg/dL (ref 6–20)
CO2: 23 mmol/L (ref 22–32)
Calcium: 8.8 mg/dL — ABNORMAL LOW (ref 8.9–10.3)
Chloride: 106 mmol/L (ref 98–111)
Creatinine, Ser: 0.87 mg/dL (ref 0.44–1.00)
GFR calc Af Amer: >60 mL/min (ref >60)
GFR calc non Af Amer: >60 mL/min (ref >60)
Glucose, Bld: 84 mg/dL (ref 70–99)
Potassium: 3.9 mmol/L (ref 3.5–5.1)
Sodium: 138 mmol/L (ref 135–145)
Total Bilirubin: 0.5 mg/dL (ref 0.3–1.2)
Total Protein: 8.2 g/dL — ABNORMAL HIGH (ref 6.5–8.1)

## 2019-05-30 LAB — RAPID URINE DRUG SCREEN, HOSP PERFORMED
Amphetamines: NOT DETECTED
Barbiturates: NOT DETECTED
Benzodiazepines: NOT DETECTED
Cocaine: NOT DETECTED
Opiates: POSITIVE — AB
Tetrahydrocannabinol: POSITIVE — AB

## 2019-05-30 LAB — CBC WITH DIFFERENTIAL/PLATELET
Abs Immature Granulocytes: 0.01 10*3/uL (ref 0.00–0.07)
Basophils Absolute: 0 10*3/uL (ref 0.0–0.1)
Basophils Relative: 1 %
Eosinophils Absolute: 0 10*3/uL (ref 0.0–0.5)
Eosinophils Relative: 1 %
HCT: 32.8 % — ABNORMAL LOW (ref 36.0–46.0)
Hemoglobin: 9.2 g/dL — ABNORMAL LOW (ref 12.0–15.0)
Immature Granulocytes: 0 %
Lymphocytes Relative: 39 %
Lymphs Abs: 2.1 10*3/uL (ref 0.7–4.0)
MCH: 21.1 pg — ABNORMAL LOW (ref 26.0–34.0)
MCHC: 28 g/dL — ABNORMAL LOW (ref 30.0–36.0)
MCV: 75.2 fL — ABNORMAL LOW (ref 80.0–100.0)
Monocytes Absolute: 0.3 10*3/uL (ref 0.1–1.0)
Monocytes Relative: 5 %
Neutro Abs: 2.8 10*3/uL (ref 1.7–7.7)
Neutrophils Relative %: 54 %
Platelets: 435 10*3/uL — ABNORMAL HIGH (ref 150–400)
RBC: 4.36 MIL/uL (ref 3.87–5.11)
RDW: 18.6 % — ABNORMAL HIGH (ref 11.5–15.5)
WBC: 5.3 10*3/uL (ref 4.0–10.5)
nRBC: 0 % (ref 0.0–0.2)

## 2019-05-30 LAB — I-STAT BETA HCG BLOOD, ED (MC, WL, AP ONLY): I-stat hCG, quantitative: <5 m[IU]/mL (ref <5)

## 2019-05-30 MED ORDER — ACETAMINOPHEN 500 MG PO TABS
1000.0000 mg | ORAL_TABLET | Freq: Once | ORAL | Status: AC
  Administered 2019-05-30: 1000 mg via ORAL
  Filled 2019-05-30: qty 2

## 2019-05-30 NOTE — ED Notes (Signed)
Discharge instructions reviewed with patient. Patient verbalizes understanding. VSS.  Patient ambulatory from ED

## 2019-05-30 NOTE — ED Provider Notes (Signed)
[facility]-EMERGENCY DEPT Provider Note   CSN: [id number] Arrival date & time: 05/30/19  1122    History   Chief Complaint Chief Complaint  Patient [id number] with  . Seizures  . Headache    HPI Margaret Clark is a 28 y.o. female.     Margaret Clark is a 28 y.o. female with history of epilepsy and asthma, who presents for evaluation of seizure while at work today. Pt reports that she had a mild headache last night, and woke up with persistent headache, wasn't feeling great, but went into work, headache got worse and then she got stiff and "froze up" and then had convulsive episode consistent with her typical seizure. Patient reports that her co-worker helped her to the ground and she did not fall or sustain any injury. Pt reports she does not remember the seizure but her co-worker told her what happened. EMS was called, but seizure had self-resolved by the time they arrived on scene. Pt now reports she feels back at her baseline aside from mild headache and reports body aches all over from frequent seizures. Reports having 1 almost everyday for the past 2 weeks, despite recent medication adjustment with her neurologist, Dr. Tera HelperBoggs, and Jose PersiaKelly Conner PA-C [facility] in mid July. Patient reports she has been taking her gabapentin and zonegran regulary and has not missed any doses. She has been working on improving sleep hygiene but continues to have issues with this. She denies any recent head injury. No fevers or recent illness. Reports chronic marijuana use unchanged, denies alcohol or other drug use.     Past Medical History:  Diagnosis Date  . Asthma   . Epilepsia (HCC)   . Seizures (HCC)     There are no active problems to display for this patient.   History reviewed. No pertinent surgical history.   OB History   No obstetric history on file.      Home Medications    Prior to Admission medications   Medication Sig Start Date End Date  Taking? Authorizing Provider  albuterol (PROVENTIL HFA;VENTOLIN HFA) 108 (90 Base) MCG/ACT inhaler Inhale 2 puffs into the lungs every 6 (six) hours as needed for wheezing or shortness of breath.  09/30/17  Yes [provider]  cyclobenzaprine (FLEXERIL) 5 MG tablet Take 5 mg by mouth 3 (three) times daily as needed for muscle spasms. 11/03/18  Yes [provider]  gabapentin (NEURONTIN) 600 MG tablet Take 600 mg by mouth 3 (three) times daily.  07/09/18  Yes [provider]  HYDROcodone-acetaminophen (NORCO/VICODIN) 5-325 MG tablet Take 1 tablet by mouth every 6 (six) hours as needed for pain. 05/25/19  Yes [provider]  hydrOXYzine (ATARAX/VISTARIL) 25 MG tablet Take 25 mg by mouth every 8 (eight) hours as needed for itching.  04/05/18  Yes [provider]  meloxicam (MOBIC) 7.5 MG tablet Take 7.5 mg by mouth daily. 05/25/19  Yes [provider]  zonisamide (ZONEGRAN) 100 MG capsule Take 2 capsules by mouth at bedtime. 09/30/18  Yes [provider]  ferrous sulfate 325 (65 FE) MG tablet Take 1 tablet (325 mg total) by mouth daily. Patient not taking: Reported on 05/30/2019 10/18/18   Doug SouJacubowitz, Sam, MD  ibuprofen (ADVIL,MOTRIN) 600 MG tablet Take 1 tablet (600 mg total) by mouth every 8 (eight) hours as needed for moderate pain. Patient not taking: Reported on 05/30/2019 11/19/18   Shaune PollackIsaacs, Cameron, MD    Family History No family history  on file.  Social History Social History   Tobacco Use  . Smoking status: Never Smoker  . Smokeless tobacco: Never Used  Substance Use Topics  . Alcohol use: Not Currently  . Drug use: Yes    Types: Marijuana     Allergies   Shellfish allergy and Sulfa antibiotics   Review of Systems Review of Systems  Constitutional: Negative for chills and fever.  HENT: Negative.   Eyes: Negative for visual disturbance.  Respiratory: Negative for cough and shortness of breath.   Cardiovascular:  Negative for chest pain.  Gastrointestinal: Negative for abdominal pain, nausea and vomiting.  Genitourinary: Negative for dysuria and frequency.  Musculoskeletal: Negative for arthralgias and myalgias.  Skin: Negative for color change and rash.  Neurological: Positive for seizures and headaches. Negative for dizziness, facial asymmetry, speech difficulty, weakness, light-headedness and numbness.     Physical Exam Updated Vital Signs BP 118/69   Pulse 82   Temp 98.8 F (37.1 C) (Oral)   Resp 18   LMP 05/24/2019   SpO2 98%   Physical Exam Vitals signs and nursing note reviewed.  Constitutional:      General: She is not in acute distress.    Appearance: She is well-developed. She is obese. She is not ill-appearing or diaphoretic.     Comments: Alert and oriented, no seizure activity, no acute distress  HENT:     Head: Normocephalic and atraumatic.  Eyes:     General:        Right eye: No discharge.        Left eye: No discharge.     Extraocular Movements: Extraocular movements intact.     Pupils: Pupils are equal, round, and reactive to light.  Neck:     Musculoskeletal: Neck supple.  Cardiovascular:     Rate and Rhythm: Normal rate and regular rhythm.     Heart sounds: Normal heart sounds.  Pulmonary:     Effort: Pulmonary effort is normal. No respiratory distress.     Breath sounds: Normal breath sounds. No wheezing or rales.     Comments: Respirations equal and unlabored, patient able to speak in full sentences, lungs clear to auscultation bilaterally Abdominal:     General: Bowel sounds are normal. There is no distension.     Palpations: Abdomen is soft. There is no mass.     Tenderness: There is no abdominal tenderness. There is no guarding.     Comments: Abdomen soft, nondistended, nontender to palpation in all quadrants without guarding or peritoneal signs  Musculoskeletal:        General: No deformity.  Skin:    General: Skin is warm and dry.     Capillary  Refill: Capillary refill takes less than 2 seconds.  Neurological:     Mental Status: She is alert and oriented to person, place, and time.     GCS: GCS eye subscore is 4. GCS verbal subscore is 5. GCS motor subscore is 6.     Coordination: Coordination normal.     Comments: Speech is clear, able to follow commands CN III-XII intact Normal strength in upper and lower extremities bilaterally including dorsiflexion and plantar flexion, strong and equal grip strength Sensation normal to light and sharp touch Moves extremities without ataxia, coordination intact   Psychiatric:        Mood and Affect: Mood normal.        Behavior: Behavior normal.      ED Treatments / Results  Labs (all  labs ordered are listed, but only abnormal results are displayed) Labs Reviewed  COMPREHENSIVE METABOLIC PANEL - Abnormal; Notable for the following components:      Result Value   Calcium 8.8 (*)    Total Protein 8.2 (*)    All other components within normal limits  CBC WITH DIFFERENTIAL/PLATELET - Abnormal; Notable for the following components:   Hemoglobin 9.2 (*)    HCT 32.8 (*)    MCV 75.2 (*)    MCH 21.1 (*)    MCHC 28.0 (*)    RDW 18.6 (*)    Platelets 435 (*)    All other components within normal limits  URINALYSIS, ROUTINE W REFLEX MICROSCOPIC - Abnormal; Notable for the following components:   APPearance HAZY (*)    Hgb urine dipstick SMALL (*)    All other components within normal limits  RAPID URINE DRUG SCREEN, HOSP PERFORMED - Abnormal; Notable for the following components:   Opiates POSITIVE (*)    Tetrahydrocannabinol POSITIVE (*)    All other components within normal limits  I-STAT BETA HCG BLOOD, ED ([facility], [facility], [facility] ONLY)    EKG EKG Interpretation  Date/Time:  Monday May 30 2019 13:04:18 EDT Ventricular Rate:  74 PR Interval:    QRS Duration: 83 QT Interval:  396 QTC Calculation: 440 R Axis:   40 Text Interpretation:  Sinus rhythm Low voltage, precordial leads  Confirmed by Zadie RhineWickline, Donald ([id number]) on 05/31/2019 10:19:52 AM   Radiology Ct Head Wo Contrast  Result Date: 05/30/2019 CLINICAL DATA:  Severe headache.  Apparent seizure EXAM: CT HEAD WITHOUT CONTRAST TECHNIQUE: Contiguous axial images were obtained from the base of the skull through the vertex without intravenous contrast. COMPARISON:  Mar 17, 2016 FINDINGS: Brain: Ventricles are normal in size and configuration. There is no intracranial mass, hemorrhage, extra-axial fluid collection, or midline shift. Brain parenchyma appears unremarkable. No acute infarct evident. Vascular: No hyperdense vessel.  No evident vascular calcification. Skull: Bony calvarium appears intact. Sinuses/Orbits: Paranasal sinuses which are visualized are clear. Orbits appear symmetric bilaterally. Other: Mastoid air cells are clear. IMPRESSION: Study within normal limits. Electronically Signed   By: Bretta BangWilliam  Woodruff III M.D.   On: 05/30/2019 14:21     Procedures Procedures (including critical care time)  Medications Ordered in ED Medications - No data to display   Initial Impression / Assessment and Plan / ED Course  I have reviewed the triage vital signs and the nursing notes.  Pertinent labs & imaging results that were available during my care of the patient were reviewed by me and considered in my medical decision making (see chart for details).  28 yo F with epilepsy present after seizure, self-resolved without medication, which occurred while at work. Increasing seizures over the past 2 weeks, despite taking meds regularly. Reports typical seizure aside from persistent frontal headache. Currently alert and at baseline, with no focal neurologic deficits. Will check labs, urine and UDS, and given persistent headache, will get non-con head CT. Ultimately will likely need to talk [facility] given increasing frequency of headaches.  Labs reassuring, no leukocytosis, hgb at baseline, no acute electrolyte  derangements and normal liver and renal function, no evidence of UTI, UDS positive for THC, which patient admits to using, and opiates, pt recently prescribed hydrocodone after rib injury. Head CT unremarkable. Will plan to [facility] with [facility].  Case discussed with Dr. Tera HelperBoggs, [facility] neurologist [facility], who recommends increasing gabapentin dosing to 1200 mg TID, and  continuing zonegran at 300 mg qHS. They are planning to discuss vagal nerve stimulator with pt at upcoming follow-up appointment. Dr. Tera HelperBoggs cautions prescribing any other medications for patient given risk for polypharmacy and sedation, and patient's previous history of SI. Patient denies any SI currently. Discussed plan for medication adjustment with patient and she expresses understanding and agreement, no further seizure activity here in ED. Stable for discharge home. Pt denies need for any refill prescriptions.  Final Clinical Impressions(s) / ED Diagnoses   Final diagnoses:  Seizures (HCC)  Nonintractable epilepsy without status epilepticus, unspecified epilepsy type [facility](HCC)    ED Discharge Orders    None       Dartha LodgeFord, Candice Tobey N, [place]-C 06/01/19 1506    [name], Layla MawKristen N, DO 06/01/19 2332

## 2019-05-30 NOTE — ED Triage Notes (Signed)
Per [facility] pt from work for having focal seizure and headache. Pt reports medications were adjusted with in past couple weeks.

## 2019-05-30 NOTE — Discharge Instructions (Addendum)
Please start taking your gabapentin 1200 mg (2 tablets) 3 times a day at breakfast, lunch and bedtime.  Please call tomorrow morning to schedule follow-up appointment with your neurologist.  Continue working on improving your sleep practices.   Return to the emergency department for any new or worsening symptoms.

## 2019-11-26 ENCOUNTER — Emergency Department
Admission: EM | Admit: 2019-11-26 | Discharge: 2019-11-26 | Disposition: A | Discharge status: Home / Self Care | Payer: Managed Care, Other (non HMO) | Attending: Emergency Medicine | Admitting: Emergency Medicine

## 2019-11-26 ENCOUNTER — Other Ambulatory Visit: Payer: Self-pay

## 2019-11-26 ENCOUNTER — Encounter: Payer: Self-pay | Admitting: Emergency Medicine

## 2019-11-26 ENCOUNTER — Emergency Department: Payer: Managed Care, Other (non HMO)

## 2019-11-26 DIAGNOSIS — Y999 Unspecified external cause status: Secondary | ICD-10-CM | POA: Diagnosis not present

## 2019-11-26 DIAGNOSIS — S63289A Dislocation of proximal interphalangeal joint of unspecified finger, initial encounter: Secondary | ICD-10-CM

## 2019-11-26 DIAGNOSIS — Y929 Unspecified place or not applicable: Secondary | ICD-10-CM | POA: Diagnosis not present

## 2019-11-26 DIAGNOSIS — S6991XA Unspecified injury of right wrist, hand and finger(s), initial encounter: Secondary | ICD-10-CM | POA: Diagnosis present

## 2019-11-26 DIAGNOSIS — S63286A Dislocation of proximal interphalangeal joint of right little finger, initial encounter: Secondary | ICD-10-CM | POA: Insufficient documentation

## 2019-11-26 DIAGNOSIS — Y9389 Activity, other specified: Secondary | ICD-10-CM | POA: Diagnosis not present

## 2019-11-26 DIAGNOSIS — S60221A Contusion of right hand, initial encounter: Secondary | ICD-10-CM | POA: Diagnosis not present

## 2019-11-26 MED ORDER — OXYCODONE-ACETAMINOPHEN 5-325 MG PO TABS
1.0000 | ORAL_TABLET | Freq: Once | ORAL | Status: AC
  Administered 2019-11-26: 08:00:00 1 via ORAL
  Filled 2019-11-26: qty 1

## 2019-11-26 MED ORDER — OXYCODONE-ACETAMINOPHEN 5-325 MG PO TABS: 1.0000 | ORAL_TABLET | ORAL | 0 refills | Status: AC | PRN

## 2019-11-26 NOTE — ED Notes (Signed)
Portable X-ray at patient bedside. 

## 2019-11-26 NOTE — ED Notes (Signed)
Pt st she has a internally planted vagus nerve stimulator that was placed by Dr. Rolene Arbour out of [facility] in Neurology. Per pt she was told by her to notify staff prior to having any CT like procedures.   Xray on hold until further investigation, EDP notified
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
## 2019-11-26 NOTE — Discharge Instructions (Signed)
Follow-up with a hand specialist in approximately 1 week.  Keep the splint on the pinky until you follow-up.  Return to the ER for new, worsening, or persistent severe pain, swelling, weakness or numbness, or any other new or worsening symptoms that concern you.
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
## 2019-11-26 NOTE — ED Notes (Signed)
Pt st she is having slight numbness and tingling to the right pinky and ring finger but verifies she is able to feel sensation in all finger tips. Fingers warm and dry to the touch

## 2019-12-19 ENCOUNTER — Emergency Department
Admission: EM | Admit: 2019-12-19 | Discharge: 2019-12-19 | Disposition: A | Discharge status: Home / Self Care | Payer: Managed Care, Other (non HMO) | Attending: Student | Admitting: Student

## 2019-12-19 ENCOUNTER — Emergency Department: Payer: Managed Care, Other (non HMO)

## 2019-12-19 ENCOUNTER — Other Ambulatory Visit: Payer: Self-pay

## 2019-12-19 DIAGNOSIS — Y92410 Unspecified street and highway as the place of occurrence of the external cause: Secondary | ICD-10-CM | POA: Insufficient documentation

## 2019-12-19 DIAGNOSIS — Y9389 Activity, other specified: Secondary | ICD-10-CM | POA: Diagnosis not present

## 2019-12-19 DIAGNOSIS — R0789 Other chest pain: Secondary | ICD-10-CM | POA: Diagnosis present

## 2019-12-19 DIAGNOSIS — Y999 Unspecified external cause status: Secondary | ICD-10-CM | POA: Insufficient documentation

## 2019-12-19 DIAGNOSIS — M7918 Myalgia, other site: Secondary | ICD-10-CM | POA: Diagnosis not present

## 2019-12-19 MED ORDER — LIDOCAINE 5 % EX PTCH
1.0000 | MEDICATED_PATCH | CUTANEOUS | Status: DC
  Administered 2019-12-19: 1 via TRANSDERMAL
  Filled 2019-12-19: qty 1

## 2019-12-19 MED ORDER — IBUPROFEN 600 MG PO TABS: 600.0000 mg | ORAL_TABLET | Freq: Three times a day (TID) | ORAL | 0 refills | Status: AC | PRN

## 2019-12-19 MED ORDER — OXYCODONE-ACETAMINOPHEN 7.5-325 MG PO TABS: 1.0000 | ORAL_TABLET | Freq: Four times a day (QID) | ORAL | 0 refills | Status: AC | PRN

## 2019-12-19 NOTE — Discharge Instructions (Addendum)
Follow discharge care instruction take medication as directed. °
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
## 2020-01-16 ENCOUNTER — Other Ambulatory Visit: Payer: Self-pay

## 2020-01-16 ENCOUNTER — Emergency Department: Payer: Managed Care, Other (non HMO)

## 2020-01-16 ENCOUNTER — Emergency Department
Admission: EM | Admit: 2020-01-16 | Discharge: 2020-01-16 | Disposition: A | Discharge status: Home / Self Care | Payer: Managed Care, Other (non HMO) | Attending: Emergency Medicine | Admitting: Emergency Medicine

## 2020-01-16 ENCOUNTER — Encounter: Payer: Self-pay | Admitting: Emergency Medicine

## 2020-01-16 DIAGNOSIS — Y93E1 Activity, personal bathing and showering: Secondary | ICD-10-CM | POA: Diagnosis not present

## 2020-01-16 DIAGNOSIS — M542 Cervicalgia: Secondary | ICD-10-CM | POA: Diagnosis not present

## 2020-01-16 DIAGNOSIS — Y92002 Bathroom of unspecified non-institutional (private) residence single-family (private) house as the place of occurrence of the external cause: Secondary | ICD-10-CM | POA: Insufficient documentation

## 2020-01-16 DIAGNOSIS — R55 Syncope and collapse: Secondary | ICD-10-CM | POA: Diagnosis not present

## 2020-01-16 DIAGNOSIS — J45909 Unspecified asthma, uncomplicated: Secondary | ICD-10-CM | POA: Diagnosis not present

## 2020-01-16 DIAGNOSIS — S0990XA Unspecified injury of head, initial encounter: Secondary | ICD-10-CM | POA: Diagnosis present

## 2020-01-16 DIAGNOSIS — Y999 Unspecified external cause status: Secondary | ICD-10-CM | POA: Diagnosis not present

## 2020-01-16 DIAGNOSIS — Z79899 Other long term (current) drug therapy: Secondary | ICD-10-CM | POA: Insufficient documentation

## 2020-01-16 DIAGNOSIS — W0110XA Fall on same level from slipping, tripping and stumbling with subsequent striking against unspecified object, initial encounter: Secondary | ICD-10-CM | POA: Diagnosis not present

## 2020-01-16 LAB — BASIC METABOLIC PANEL WITH GFR
Anion gap: 6 (ref 5–15)
BUN: 13 mg/dL (ref 6–20)
CO2: 24 mmol/L (ref 22–32)
Calcium: 8.7 mg/dL — ABNORMAL LOW (ref 8.9–10.3)
Chloride: 108 mmol/L (ref 98–111)
Creatinine, Ser: 0.75 mg/dL (ref 0.44–1.00)
GFR calc Af Amer: >60 mL/min
GFR calc non Af Amer: >60 mL/min
Glucose, Bld: 95 mg/dL (ref 70–99)
Potassium: 3.8 mmol/L (ref 3.5–5.1)
Sodium: 138 mmol/L (ref 135–145)

## 2020-01-16 LAB — CBC
HCT: 33.7 % — ABNORMAL LOW (ref 36.0–46.0)
Hemoglobin: 9.9 g/dL — ABNORMAL LOW (ref 12.0–15.0)
MCH: 20.5 pg — ABNORMAL LOW (ref 26.0–34.0)
MCHC: 29.4 g/dL — ABNORMAL LOW (ref 30.0–36.0)
MCV: 69.8 fL — ABNORMAL LOW (ref 80.0–100.0)
Platelets: 411 10*3/uL — ABNORMAL HIGH (ref 150–400)
RBC: 4.83 MIL/uL (ref 3.87–5.11)
RDW: 18.2 % — ABNORMAL HIGH (ref 11.5–15.5)
WBC: 7.7 10*3/uL (ref 4.0–10.5)
nRBC: 0 % (ref 0.0–0.2)

## 2020-01-16 LAB — ETHANOL: Alcohol, Ethyl (B): <10 mg/dL

## 2020-01-16 NOTE — ED Provider Notes (Signed)
[facility] Emergency Department Provider Note  ____________________________________________   First MD Initiated Contact with Patient 01/16/20 [phone]     (approximate)  I have reviewed the triage vital signs and the nursing notes.   HISTORY  Chief Complaint Loss of Consciousness    HPI Margaret Clark is a 29 y.o. female with medical history as listed below who presents  by private vehicle for evaluation after a syncopal episode around 10 PM.  She says that she "smoked a hookah" (which she does regularly) before getting in a bath.  She started to feel dizzy while she was in the bathtub and when she got out she slipped and hit her head.  She does not believe she lost consciousness but the episode scared her so she got out of the bathroom when running for help.  She had assistance getting her back to her bed but she may have slipped and fallen again.  She said that when this kind of thing happens in the past she has been able to sleep it off but when she woke up later she still felt unsteady and dizzy with a little bit of a headache so she wanted to come in for evaluation.  She denies recent fever/chills, sore throat, chest pain, shortness of breath, cough, nausea, vomiting, and abdominal pain.  She feels better now.  She has pain in her neck and still has a mild headache.  She has no focal numbness nor weakness.  The onset of the symptoms was acute and severe nothing in particular makes them better or worse.  No seizure-like activity.        Past Medical History:  Diagnosis Date  . Asthma   . Epilepsia ([place])   . Seizures ([place])     There are no problems to display for this patient.   Past Surgical History:  Procedure Laterality Date  . VAGAL NERVE STIMULATOR REMOVAL      Prior to Admission medications   Medication Sig Start Date End Date Taking? Authorizing Provider  albuterol (PROVENTIL HFA;VENTOLIN HFA) 108 (90 Base) MCG/ACT inhaler Inhale 2  puffs into the lungs every 6 (six) hours as needed for wheezing or shortness of breath.  09/30/17   [provider]  cyclobenzaprine (FLEXERIL) 10 MG tablet Take 10 mg by mouth 3 (three) times daily as needed for muscle spasms.    [provider]  hydrOXYzine (ATARAX/VISTARIL) 25 MG tablet Take 25 mg by mouth every 8 (eight) hours as needed for itching.  04/05/18   [provider]  ibuprofen (ADVIL) 600 MG tablet Take 1 tablet (600 mg total) by mouth every 8 (eight) hours as needed. 12/19/19   Sable Feil, PA-C  zonisamide (ZONEGRAN) 100 MG capsule Take 200 mg by mouth at bedtime.  09/30/18   [provider]    Allergies Shellfish allergy and Sulfa antibiotics  No family history on file.  Social History Social History   Tobacco Use  . Smoking status: Never Smoker  . Smokeless tobacco: Never Used  Substance Use Topics  . Alcohol use: Not Currently  . Drug use: Yes    Types: Marijuana    Review of Systems Constitutional: No fever/chills Eyes: No visual changes. ENT: No sore throat. Cardiovascular: Denies chest pain. Respiratory: Denies shortness of breath. Gastrointestinal: No abdominal pain.  No nausea, no vomiting.  No diarrhea.  No constipation. Genitourinary: Negative for dysuria. Musculoskeletal: Mild neck pain.  Negative for back pain. Integumentary: Negative for rash. Neurological:  Generalized weakness and mild headache but negative for focal weakness or numbness.   ____________________________________________   PHYSICAL EXAM:  VITAL SIGNS: ED Triage Vitals  Enc Vitals Group     BP 01/16/20 0447 124/72     Pulse Rate 01/16/20 0447 100     Resp 01/16/20 0447 18     Temp 01/16/20 0447 99.9 F (37.7 C)     Temp Source 01/16/20 0447 Oral     SpO2 01/16/20 0447 100 %     Weight 01/16/20 0434 111.1 kg (245 lb)     Height 01/16/20 0434 1.651 m (5\' 5" )     Head Circumference --      Peak Flow --      Pain Score 01/16/20 0434 9      Pain Loc --      Pain Edu? --      Excl. in GC? --     Constitutional: Alert and oriented.  Well-appearing and in no acute distress. Eyes: Conjunctivae are normal.  Head: Atraumatic. Nose: No congestion/rhinnorhea. Mouth/Throat: Patient is wearing a mask. Neck: No stridor.  No meningeal signs.   Cardiovascular: Normal rate, regular rhythm. Good peripheral circulation. Grossly normal heart sounds. Respiratory: Normal respiratory effort.  No retractions. Gastrointestinal: Obese.  Soft and nontender. No distention.  Musculoskeletal: No lower extremity tenderness nor edema. No gross deformities of extremities.  No tenderness to palpation of the cervical spine and no reproducible pain or tenderness with flexion, extension, and rotation side to side of her head and neck.  No tenderness to palpation of the thoracic spine. Neurologic:  Normal speech and language. No gross focal neurologic deficits are appreciated.  Skin:  Skin is warm, dry and intact. Psychiatric: Mood and affect are normal. Speech and behavior are normal.  ____________________________________________   LABS (all labs ordered are listed, but only abnormal results are displayed)  Labs Reviewed  BASIC METABOLIC PANEL - Abnormal; Notable for the following components:      Result Value   Calcium 8.7 (*)    All other components within normal limits  CBC - Abnormal; Notable for the following components:   Hemoglobin 9.9 (*)    HCT 33.7 (*)    MCV 69.8 (*)    MCH 20.5 (*)    MCHC 29.4 (*)    RDW 18.2 (*)    Platelets 411 (*)    All other components within normal limits  ETHANOL  URINALYSIS, COMPLETE (UACMP) WITH MICROSCOPIC  URINE DRUG SCREEN, QUALITATIVE ([facility] ONLY)  CBG MONITORING, ED  POC URINE PREG, ED   ____________________________________________  EKG  ED ECG REPORT I, 01/18/20, the attending physician, personally viewed and interpreted this ECG.  Date: 01/16/2020 EKG Time: 4:40 AM Rate:  100 Rhythm: Borderline sinus tachycardia QRS Axis: normal Intervals: normal ST/T Wave abnormalities: Inverted T waves in lead III and V3, otherwise unremarkable Narrative Interpretation: no definitive evidence of acute ischemia; does not meet STEMI criteria.  ____________________________________________  RADIOLOGY I, 01/18/2020, personally viewed and evaluated these images (plain radiographs) as part of my medical decision making, as well as reviewing the written report by the radiologist.  ED MD interpretation: No acute abnormalities identified on head CT  Official radiology report(s): CT Head Wo Contrast  Result Date: 01/16/2020 CLINICAL DATA:  Syncopal episode. Fell and hit head. EXAM: CT HEAD WITHOUT CONTRAST TECHNIQUE: Contiguous axial images were obtained from the base of the skull through the vertex without intravenous contrast. COMPARISON:  05/30/2019 FINDINGS: Brain: The ventricles are  normal in size and configuration. No extra-axial fluid collections are identified. The gray-white differentiation is maintained. No CT findings for acute hemispheric infarction or intracranial hemorrhage. No mass lesions. The brainstem and cerebellum are normal. Vascular: No hyperdense vessels or obvious aneurysm. Skull: No acute skull fracture. No bone lesion. Sinuses/Orbits: The paranasal sinuses and mastoid air cells are clear. The globes are intact. Other: No scalp lesions, laceration or hematoma. IMPRESSION: Normal head CT. Electronically Signed   By: Rudie Meyer M.D.   On: 01/16/2020 05:57    ____________________________________________   PROCEDURES   Procedure(s) performed (including Critical Care):  Procedures   ____________________________________________   INITIAL IMPRESSION / MDM / ASSESSMENT AND PLAN / ED COURSE  As part of my medical decision making, I reviewed the following data within the electronic MEDICAL RECORD NUMBER Nursing notes reviewed and incorporated, Labs reviewed ,  EKG interpreted  and Notes from prior ED visits   Differential diagnosis includes, but is not limited to, nonspecific syncope, cardiogenic syncope, vasovagal episode, medication/drug side effect, electrolyte or metabolic abnormality.  Vital signs are stable including blood pressure.    Basic metabolic panel is reassuring, CBC is essentially normal other than some mild anemia but hematocrit is not less than 30, alcohol is negative.  Nonischemic EKG with no arrhythmia.  Low risk based on [place] syncope rule.  No chest pain or shortness of breath.  The patient admits to smoking marijuana prior to getting in a warm bath and I think she most likely had a vasovagal episode secondary to the marijuana and the warm bath.  She feels better now and is steady on her feet.  She is safe for discharge home.  I had my usual and customary management recommendations and return precautions.      ____________________________________________  FINAL CLINICAL IMPRESSION(S) / ED DIAGNOSES  Final diagnoses:  Syncope and collapse  Minor head injury, initial encounter     MEDICATIONS GIVEN DURING THIS VISIT:  Medications - No data to display   ED Discharge Orders    None      *Please note:  MAHLI GLAHN was evaluated in Emergency Department on 01/16/2020 for the symptoms described in the history of present illness. She was evaluated in the context of the global COVID-19 pandemic, which necessitated consideration that the patient might be at risk for infection with the SARS-CoV-2 virus that causes COVID-19. Institutional protocols and algorithms that pertain to the evaluation of patients at risk for COVID-19 are in a state of rapid change based on information released by regulatory bodies including the CDC and federal and state organizations. These policies and algorithms were followed during the patient's care in the ED.  Some ED evaluations and interventions may be delayed as a result of limited  staffing during the pandemic.*  Note:  This document was prepared using Dragon voice recognition software and may include unintentional dictation errors.   Loleta Rose, MD 01/16/20 [phone]

## 2020-01-16 NOTE — ED Notes (Signed)
This RN attempted x 2 phlebotomy without success.

## 2020-01-16 NOTE — Discharge Instructions (Signed)
You have been seen today in the Emergency Department (ED)  for syncope (passing out) and hitting your head.  Your workup including labs and EKG show reassuring results.  Your symptoms may be due to dehydration, so it is important that you drink plenty of non-alcoholic fluids.  Please call your regular doctor as soon as possible to schedule the next available clinic appointment to follow up with him/her regarding your visit to the ED and your symptoms.  Return to the Emergency Department (ED)  if you have any further syncopal episodes (pass out again) or develop ANY chest pain, pressure, tightness, trouble breathing, sudden sweating, or other symptoms that concern you.

## 2020-01-16 NOTE — ED Triage Notes (Signed)
Pt arrives POV to triage with c/o syncopal event around 2200 when she was getting out of the bathtub. Pt reports that she hit her head. Pt is in NAD.

## 2020-04-03 ENCOUNTER — Emergency Department: Payer: 59

## 2020-04-03 ENCOUNTER — Emergency Department
Admission: EM | Admit: 2020-04-03 | Discharge: 2020-04-03 | Disposition: A | Discharge status: Home / Self Care | Payer: 59 | Attending: Emergency Medicine | Admitting: Emergency Medicine

## 2020-04-03 ENCOUNTER — Other Ambulatory Visit: Payer: Self-pay

## 2020-04-03 DIAGNOSIS — J45909 Unspecified asthma, uncomplicated: Secondary | ICD-10-CM | POA: Insufficient documentation

## 2020-04-03 DIAGNOSIS — Z79899 Other long term (current) drug therapy: Secondary | ICD-10-CM | POA: Diagnosis not present

## 2020-04-03 DIAGNOSIS — R569 Unspecified convulsions: Secondary | ICD-10-CM | POA: Insufficient documentation

## 2020-04-03 LAB — CBC WITH DIFFERENTIAL/PLATELET
Abs Immature Granulocytes: 0.02 10*3/uL (ref 0.00–0.07)
Basophils Absolute: 0 10*3/uL (ref 0.0–0.1)
Basophils Relative: 1 %
Eosinophils Absolute: 0.1 10*3/uL (ref 0.0–0.5)
Eosinophils Relative: 2 %
HCT: 31.7 % — ABNORMAL LOW (ref 36.0–46.0)
Hemoglobin: 9 g/dL — ABNORMAL LOW (ref 12.0–15.0)
Immature Granulocytes: 0 %
Lymphocytes Relative: 34 %
Lymphs Abs: 1.7 10*3/uL (ref 0.7–4.0)
MCH: 19.6 pg — ABNORMAL LOW (ref 26.0–34.0)
MCHC: 28.4 g/dL — ABNORMAL LOW (ref 30.0–36.0)
MCV: 68.9 fL — ABNORMAL LOW (ref 80.0–100.0)
Monocytes Absolute: 0.4 10*3/uL (ref 0.1–1.0)
Monocytes Relative: 8 %
Neutro Abs: 2.8 10*3/uL (ref 1.7–7.7)
Neutrophils Relative %: 55 %
Platelets: 500 10*3/uL — ABNORMAL HIGH (ref 150–400)
RBC: 4.6 MIL/uL (ref 3.87–5.11)
RDW: 18.6 % — ABNORMAL HIGH (ref 11.5–15.5)
Smear Review: NORMAL
WBC: 5 10*3/uL (ref 4.0–10.5)
nRBC: 0 % (ref 0.0–0.2)

## 2020-04-03 LAB — COMPREHENSIVE METABOLIC PANEL
ALT: 15 U/L (ref 0–44)
AST: 17 U/L (ref 15–41)
Albumin: 3.8 g/dL (ref 3.5–5.0)
Alkaline Phosphatase: 41 U/L (ref 38–126)
Anion gap: 8 (ref 5–15)
BUN: 15 mg/dL (ref 6–20)
CO2: 24 mmol/L (ref 22–32)
Calcium: 8.6 mg/dL — ABNORMAL LOW (ref 8.9–10.3)
Chloride: 104 mmol/L (ref 98–111)
Creatinine, Ser: 0.8 mg/dL (ref 0.44–1.00)
GFR calc Af Amer: >60 mL/min (ref >60)
GFR calc non Af Amer: >60 mL/min (ref >60)
Glucose, Bld: 98 mg/dL (ref 70–99)
Potassium: 4.1 mmol/L (ref 3.5–5.1)
Sodium: 136 mmol/L (ref 135–145)
Total Bilirubin: 0.4 mg/dL (ref 0.3–1.2)
Total Protein: 8.1 g/dL (ref 6.5–8.1)

## 2020-04-03 MED ORDER — ACETAMINOPHEN 500 MG PO TABS
1000.0000 mg | ORAL_TABLET | Freq: Once | ORAL | Status: AC
  Administered 2020-04-03: 1000 mg via ORAL
  Filled 2020-04-03: qty 2

## 2020-04-03 NOTE — Discharge Instructions (Addendum)
Continue take your seizure medications as prescribed.  Follow-up with your neurologist as scheduled.  Return to the ER for new, worsening, or recurrent seizures, severe headache, vomiting, weakness, or any other new or worsening symptoms that concern you.

## 2020-04-03 NOTE — ED Triage Notes (Signed)
Pt arrived via [facility] from work with 2 seizures. Pt has vagal nerve neurostimulator. Pt has knot on back of her head due to hitting head on floor. Vitals with ems cbg 100, 16, 110/80, 80.

## 2020-04-03 NOTE — ED Provider Notes (Addendum)
[facility] Emergency Department Provider Note ____________________________________________   First MD Initiated Contact with Patient 04/03/20 [phone]     (approximate)  I have reviewed the triage vital signs and the nursing notes.   HISTORY  Chief Complaint Seizures    HPI Margaret Clark is a 29 y.o. female with a history of seizure disorder and other PMH as noted below who presents after 2 witnessed seizures while at work.  The patient has a vagal nerve stimulator which was activated during the second seizure.  She did fall to the floor and hit her head on concrete.  The patient was initially confused and postictal per EMS, but now has returned to her baseline.  She states that she had another seizure yesterday, and previously it had been a few weeks ago.  She states that she has been under increased stress because her uncle recently died.  However she denies any other changes in her routine or decreased sleep.  She states that she is compliant with her clobazam and Zonegran.  The patient reports a headache and some neck pain but denies other acute symptoms.  Past Medical History:  Diagnosis Date  . Asthma   . Epilepsia ([facility])   . Seizures ([facility])     There are no problems to display for this patient.   Past Surgical History:  Procedure Laterality Date  . VAGAL NERVE STIMULATOR REMOVAL      Prior to Admission medications   Medication Sig Start Date End Date Taking? Authorizing Provider  albuterol (PROVENTIL HFA;VENTOLIN HFA) 108 (90 Base) MCG/ACT inhaler Inhale 2 puffs into the lungs every 6 (six) hours as needed for wheezing or shortness of breath.  09/30/17   [provider]  cyclobenzaprine (FLEXERIL) 10 MG tablet Take 10 mg by mouth 3 (three) times daily as needed for muscle spasms.    [provider]  hydrOXYzine (ATARAX/VISTARIL) 25 MG tablet Take 25 mg by mouth every 8 (eight) hours as needed for itching.  04/05/18   [provider]  ibuprofen (ADVIL) 600 MG tablet Take 1 tablet (600 mg total) by mouth every 8 (eight) hours as needed. 12/19/19   Joni Reining, PA-C  zonisamide (ZONEGRAN) 100 MG capsule Take 200 mg by mouth at bedtime.  09/30/18   [provider]    Allergies Shellfish allergy and Sulfa antibiotics  History reviewed. No pertinent family history.  Social History Social History   Tobacco Use  . Smoking status: Never Smoker  . Smokeless tobacco: Never Used  Vaping Use  . Vaping Use: Some days  . Substances: Nicotine  Substance Use Topics  . Alcohol use: Not Currently  . Drug use: Yes    Types: Marijuana    Review of Systems  Constitutional: No fever/chills Eyes: No visual changes. ENT: No sore throat. Cardiovascular: Denies chest pain. Respiratory: Denies shortness of breath. Gastrointestinal: No vomiting. Genitourinary: Negative for flank pain. Musculoskeletal: Negative for back pain. Skin: Negative for rash. Neurological: Positive for headache.   ____________________________________________   PHYSICAL EXAM:  VITAL SIGNS: ED Triage Vitals  Enc Vitals Group     BP      Pulse      Resp      Temp      Temp src      SpO2      Weight      Height      Head Circumference      Peak Flow  Pain Score      Pain Loc      Pain Edu?      Excl. in GC?     Constitutional: Alert and oriented.  Relatively well appearing and in no acute distress. Eyes: Conjunctivae are normal.  EOMI.  PERRLA. Head: Atraumatic. Nose: No congestion/rhinnorhea. Mouth/Throat: Mucous membranes are moist.   Neck: Normal range of motion.  Mild midline and paraspinal cervical tenderness.  No step-off or crepitus. Cardiovascular: Normal rate, regular rhythm.  Good peripheral circulation. Respiratory: Normal respiratory effort.  No retractions.  Gastrointestinal: No distention.  Musculoskeletal:  Extremities warm and well perfused.  Neurologic:  Normal speech and language.   Motor and sensory intact in all extremities.  Normal coordination. Skin:  Skin is warm and dry. No rash noted. Psychiatric: Mood and affect are normal. Speech and behavior are normal.  ____________________________________________   LABS (all labs ordered are listed, but only abnormal results are displayed)  Labs Reviewed  COMPREHENSIVE METABOLIC PANEL - Abnormal; Notable for the following components:      Result Value   Calcium 8.6 (*)    All other components within normal limits  CBC WITH DIFFERENTIAL/PLATELET - Abnormal; Notable for the following components:   Hemoglobin 9.0 (*)    HCT 31.7 (*)    MCV 68.9 (*)    MCH 19.6 (*)    MCHC 28.4 (*)    RDW 18.6 (*)    Platelets 500 (*)    All other components within normal limits   ____________________________________________  EKG  ED ECG REPORT I, Dionne Bucy, the attending physician, personally viewed and interpreted this ECG.  Date: 04/03/2020 EKG Time: 0956 Rate: 78 Rhythm: normal sinus rhythm QRS Axis: normal Intervals: normal ST/T Wave abnormalities: normal Narrative Interpretation: no evidence of acute ischemia   ____________________________________________  RADIOLOGY  CT head: No acute abnormality CT cervical spine: No acute traumatic findings  ____________________________________________   PROCEDURES  Procedure(s) performed: No  Procedures  Critical Care performed: No ____________________________________________   INITIAL IMPRESSION / ASSESSMENT AND PLAN / ED COURSE  Pertinent labs & imaging results that were available during my care of the patient were reviewed by me and considered in my medical decision making (see chart for details).  29 year old female with a seizure disorder with a vagal nerve stimulator presents after 2 back-to-back seizures while at work.  She fell and hit her head.  She was initially postictal with EMS but now has returned to her baseline.  I have reviewed the  past medical records in Epic.  The patient has had a few prior ED visits over the last several months here, for unrelated symptoms.  She follows with neurology at [facility] and I was able to confirm that she is on clobazam and Zonegran.  She has a vagal nerve stimulator that was placed last year.  On exam currently she is alert and oriented x4.  Neurologic exam is nonfocal.  Her vital signs are normal.  She does have some midline cervical spinal tenderness although the pain is mainly paraspinal.  The remainder of the exam is as described above.  Presentation is consistent with a breakthrough seizure.  The etiology of the patient's recent seizure is unclear, however she does report recent increased stress due to the death of a family member and I suspect that her sleep may be affected as well.  Per the recent neurology note, her seizures often occur in clusters.  We will obtain basic labs, CT head and C-spine due  to the fall and trauma, and observe the patient for a few hours.  ----------------------------------------- 11:10 AM on 04/03/2020 -----------------------------------------  Lab work-up is unremarkable.  The patient is anemic but this is chronic and there are no significant changes in her CBC.  CT head and C-spine are negative for acute findings.  On reassessment, the patient states that she feels well.  She would like to go home.  I feel that discharge is reasonable.  She again states that she has been stressed out and has not been getting much sleep.  She confirms that she has been compliant with her medications.  I counseled her on the results of the work-up.  I advised her to follow-up with her neurologist at [facility].  Return precautions given, and she expresses understanding.   ____________________________________________   FINAL CLINICAL IMPRESSION(S) / ED DIAGNOSES  Final diagnoses:  Seizure ([place])      NEW MEDICATIONS STARTED DURING THIS VISIT:  New Prescriptions    No medications on file     Note:  This document was prepared using Dragon voice recognition software and may include unintentional dictation errors.    Arta Silence, MD 04/03/20 1111    Arta Silence, MD 04/03/20 1125

## 2020-05-31 ENCOUNTER — Emergency Department
Admission: EM | Admit: 2020-05-31 | Discharge: 2020-05-31 | Disposition: A | Discharge status: Home / Self Care | Payer: PRIVATE HEALTH INSURANCE | Attending: Emergency Medicine | Admitting: Emergency Medicine

## 2020-05-31 DIAGNOSIS — G40909 Epilepsy, unspecified, not intractable, without status epilepticus: Secondary | ICD-10-CM | POA: Insufficient documentation

## 2020-05-31 DIAGNOSIS — Z5321 Procedure and treatment not carried out due to patient leaving prior to being seen by health care provider: Secondary | ICD-10-CM | POA: Insufficient documentation

## 2020-05-31 NOTE — ED Triage Notes (Signed)
First Nurse Note:  ARrives via [facility] for ED evaluation of seizure.  Per report, seizure was witnessed at work.  Has history of seizure.  On EMS arrival, patient was AAOx3.  Skin warm and dry. NAD

## 2020-08-24 ENCOUNTER — Telehealth: Payer: Self-pay | Admitting: Emergency Medicine

## 2020-08-24 ENCOUNTER — Emergency Department
Admission: EM | Admit: 2020-08-24 | Discharge: 2020-08-24 | Discharge status: Against Medical Advice | Payer: PRIVATE HEALTH INSURANCE

## 2020-08-24 NOTE — ED Notes (Signed)
EMS reported to this RN that pt was at work and hit her head. Pt stated that afterwards she felt an "aura" coming on so she laid down. Pt had witnessed seizure. Pt has hx/o same. All vital signs were stable for EMS and pt had no obvious injuries noted.  This RN called pt back to triage, RN explained pts visitors would have to wait outside until pt went to treatment room. Pt asked how long the wait would be. RN explained that I was not aware of how long she would have to wait. Pt stated that she did not want to wait hours to be seen that she just wanted her head checked out. RN informed pt that she would have protocols done while waiting to be seen that I just could not give her a time frame. Pt stated she was concerned about having a seizure in the lobby and no one being with her, RN explained that we have a nurse who sits in the lobby and that the patient would be looked after while in the lobby waiting for a room. Pt stated that she wanted to go somewhere else to be seen because she did not want to wait. This RN removed a 20 G IV from pts left forearm. Pt was ambulatory out of ED without difficulty or distress. Pt A & O.

## 2020-08-24 NOTE — Telephone Encounter (Signed)
Called patient due to left emergency department before provider exam to inquire about condition and follow up plans. Says she has messaged her doctor, but will try again.  She says she will return if worse.

## 2021-04-30 ENCOUNTER — Encounter (HOSPITAL_BASED_OUTPATIENT_CLINIC_OR_DEPARTMENT_OTHER): Payer: Self-pay | Admitting: Emergency Medicine

## 2021-04-30 ENCOUNTER — Emergency Department (HOSPITAL_BASED_OUTPATIENT_CLINIC_OR_DEPARTMENT_OTHER)
Admission: EM | Admit: 2021-04-30 | Discharge: 2021-04-30 | Disposition: A | Discharge status: Home / Self Care | Payer: 59 | Attending: Emergency Medicine | Admitting: Emergency Medicine

## 2021-04-30 ENCOUNTER — Other Ambulatory Visit: Payer: Self-pay

## 2021-04-30 DIAGNOSIS — Z5321 Procedure and treatment not carried out due to patient leaving prior to being seen by health care provider: Secondary | ICD-10-CM | POA: Insufficient documentation

## 2021-04-30 DIAGNOSIS — M25551 Pain in right hip: Secondary | ICD-10-CM | POA: Diagnosis present

## 2021-04-30 NOTE — ED Triage Notes (Signed)
Pt reports right hip pain for 3 days. Pt denies any known injury. Pt able to ambulate with steady gait. Nad noted.
